# Patient Record
Sex: Male | Born: 1955 | Race: Black or African American | Hispanic: No | Marital: Single | Smoking: Former smoker
Health system: Southern US, Community
[De-identification: ages and names within clinical notes are randomized; demographics above are authoritative.]

## PROBLEM LIST (undated history)

## (undated) DIAGNOSIS — K219 Gastro-esophageal reflux disease without esophagitis: Secondary | ICD-10-CM

## (undated) DIAGNOSIS — Z87898 Personal history of other specified conditions: Secondary | ICD-10-CM

## (undated) DIAGNOSIS — E785 Hyperlipidemia, unspecified: Secondary | ICD-10-CM

## (undated) DIAGNOSIS — I10 Essential (primary) hypertension: Secondary | ICD-10-CM

## (undated) HISTORY — DX: Essential (primary) hypertension: I10

## (undated) HISTORY — PX: COLONOSCOPY: SHX174

## (undated) HISTORY — DX: Personal history of other specified conditions: Z87.898

## (undated) HISTORY — DX: Hyperlipidemia, unspecified: E78.5

## (undated) HISTORY — PX: CIRCUMCISION: SUR203

## (undated) HISTORY — PX: OTHER SURGICAL HISTORY: SHX169

## (undated) HISTORY — DX: Gastro-esophageal reflux disease without esophagitis: K21.9

## (undated) HISTORY — PX: WOUND CLOSURE SECONDARY ABDOMEN: SUR1444

## (undated) HISTORY — PX: TONSILLECTOMY: SUR1361

## (undated) HISTORY — DX: Morbid (severe) obesity due to excess calories: E66.01

---

## 1990-02-26 DIAGNOSIS — E1165 Type 2 diabetes mellitus with hyperglycemia: Secondary | ICD-10-CM

## 1997-09-09 ENCOUNTER — Other Ambulatory Visit: Admission: RE | Admit: 1997-09-09 | Discharge: 1997-09-09 | Payer: Self-pay | Admitting: Family Medicine

## 1997-12-16 ENCOUNTER — Ambulatory Visit (HOSPITAL_COMMUNITY): Admission: RE | Admit: 1997-12-16 | Discharge: 1997-12-16 | Payer: Self-pay | Admitting: Orthopedic Surgery

## 1997-12-17 ENCOUNTER — Encounter: Payer: Self-pay | Admitting: Cardiology

## 1997-12-28 ENCOUNTER — Ambulatory Visit (HOSPITAL_COMMUNITY): Admission: RE | Admit: 1997-12-28 | Discharge: 1997-12-28 | Payer: Self-pay | Admitting: Cardiology

## 1998-05-23 ENCOUNTER — Emergency Department (HOSPITAL_COMMUNITY): Admission: EM | Admit: 1998-05-23 | Discharge: 1998-05-23 | Payer: Self-pay | Admitting: Emergency Medicine

## 1998-07-24 ENCOUNTER — Emergency Department (HOSPITAL_COMMUNITY): Admission: EM | Admit: 1998-07-24 | Discharge: 1998-07-24 | Payer: Self-pay | Admitting: Emergency Medicine

## 1998-09-12 ENCOUNTER — Emergency Department (HOSPITAL_COMMUNITY): Admission: EM | Admit: 1998-09-12 | Discharge: 1998-09-12 | Payer: Self-pay | Admitting: *Deleted

## 2000-02-27 DIAGNOSIS — Z87898 Personal history of other specified conditions: Secondary | ICD-10-CM

## 2000-02-27 HISTORY — DX: Personal history of other specified conditions: Z87.898

## 2001-11-10 ENCOUNTER — Emergency Department (HOSPITAL_COMMUNITY): Admission: EM | Admit: 2001-11-10 | Discharge: 2001-11-10 | Payer: Self-pay | Admitting: Emergency Medicine

## 2003-09-07 ENCOUNTER — Emergency Department (HOSPITAL_COMMUNITY): Admission: EM | Admit: 2003-09-07 | Discharge: 2003-09-07 | Payer: Self-pay | Admitting: Family Medicine

## 2003-09-17 ENCOUNTER — Encounter: Admission: RE | Admit: 2003-09-17 | Discharge: 2003-09-17 | Payer: Self-pay | Admitting: Internal Medicine

## 2003-09-23 ENCOUNTER — Encounter: Admission: RE | Admit: 2003-09-23 | Discharge: 2003-09-23 | Payer: Self-pay | Admitting: Internal Medicine

## 2003-09-27 ENCOUNTER — Encounter: Admission: RE | Admit: 2003-09-27 | Discharge: 2003-09-27 | Payer: Self-pay | Admitting: Internal Medicine

## 2003-11-09 ENCOUNTER — Encounter (HOSPITAL_BASED_OUTPATIENT_CLINIC_OR_DEPARTMENT_OTHER): Admission: RE | Admit: 2003-11-09 | Discharge: 2004-01-07 | Payer: Self-pay | Admitting: Internal Medicine

## 2003-12-27 ENCOUNTER — Emergency Department (HOSPITAL_COMMUNITY): Admission: EM | Admit: 2003-12-27 | Discharge: 2003-12-27 | Payer: Self-pay | Admitting: Family Medicine

## 2004-02-03 ENCOUNTER — Ambulatory Visit: Payer: Self-pay | Admitting: Internal Medicine

## 2004-04-04 ENCOUNTER — Encounter (HOSPITAL_BASED_OUTPATIENT_CLINIC_OR_DEPARTMENT_OTHER): Admission: RE | Admit: 2004-04-04 | Discharge: 2004-05-26 | Payer: Self-pay | Admitting: Internal Medicine

## 2004-09-27 ENCOUNTER — Ambulatory Visit: Payer: Self-pay | Admitting: Internal Medicine

## 2004-10-05 ENCOUNTER — Ambulatory Visit: Payer: Self-pay | Admitting: Internal Medicine

## 2004-10-20 ENCOUNTER — Ambulatory Visit: Payer: Self-pay | Admitting: Internal Medicine

## 2004-10-29 ENCOUNTER — Encounter (INDEPENDENT_AMBULATORY_CARE_PROVIDER_SITE_OTHER): Payer: Self-pay | Admitting: *Deleted

## 2004-11-03 ENCOUNTER — Ambulatory Visit: Payer: Self-pay | Admitting: Internal Medicine

## 2004-11-10 ENCOUNTER — Ambulatory Visit: Payer: Self-pay | Admitting: Internal Medicine

## 2004-12-26 ENCOUNTER — Emergency Department (HOSPITAL_COMMUNITY): Admission: EM | Admit: 2004-12-26 | Discharge: 2004-12-26 | Payer: Self-pay | Admitting: Emergency Medicine

## 2005-01-03 ENCOUNTER — Emergency Department (HOSPITAL_COMMUNITY): Admission: EM | Admit: 2005-01-03 | Discharge: 2005-01-03 | Payer: Self-pay | Admitting: Emergency Medicine

## 2005-07-05 ENCOUNTER — Ambulatory Visit: Payer: Self-pay | Admitting: Internal Medicine

## 2005-09-18 ENCOUNTER — Ambulatory Visit: Payer: Self-pay | Admitting: Internal Medicine

## 2005-09-19 ENCOUNTER — Ambulatory Visit: Payer: Self-pay | Admitting: Hospitalist

## 2005-12-25 ENCOUNTER — Encounter (INDEPENDENT_AMBULATORY_CARE_PROVIDER_SITE_OTHER): Payer: Self-pay | Admitting: *Deleted

## 2005-12-25 ENCOUNTER — Ambulatory Visit: Payer: Self-pay | Admitting: Internal Medicine

## 2005-12-25 LAB — CONVERTED CEMR LAB
Creatinine, Urine: 110.6 mg/dL
Microalb Creat Ratio: 13.6 mg/g (ref 0.0–30.0)
Microalb, Ur: 1.5 mg/dL (ref 0.00–1.89)
TSH: 1.017 microintl units/mL (ref 0.350–5.50)

## 2006-01-03 DIAGNOSIS — E785 Hyperlipidemia, unspecified: Secondary | ICD-10-CM

## 2006-01-03 DIAGNOSIS — I10 Essential (primary) hypertension: Secondary | ICD-10-CM

## 2006-01-03 DIAGNOSIS — Z8679 Personal history of other diseases of the circulatory system: Secondary | ICD-10-CM | POA: Insufficient documentation

## 2006-07-29 ENCOUNTER — Encounter (INDEPENDENT_AMBULATORY_CARE_PROVIDER_SITE_OTHER): Payer: Self-pay | Admitting: Dermatology

## 2006-07-29 ENCOUNTER — Ambulatory Visit: Payer: Self-pay | Admitting: Internal Medicine

## 2006-07-29 LAB — CONVERTED CEMR LAB
ALT: 12 units/L (ref 0–53)
AST: 11 units/L (ref 0–37)
Albumin: 4.5 g/dL (ref 3.5–5.2)
Alkaline Phosphatase: 60 units/L (ref 39–117)
BUN: 15 mg/dL (ref 6–23)
Blood Glucose, Fingerstick: 289
CO2: 25 meq/L (ref 19–32)
Calcium: 9.8 mg/dL (ref 8.4–10.5)
Chloride: 99 meq/L (ref 96–112)
Creatinine, Ser: 0.98 mg/dL (ref 0.40–1.50)
Glucose, Bld: 285 mg/dL — ABNORMAL HIGH (ref 70–99)
Potassium: 4.7 meq/L (ref 3.5–5.3)
Sodium: 133 meq/L — ABNORMAL LOW (ref 135–145)
Total Bilirubin: 0.3 mg/dL (ref 0.3–1.2)
Total Protein: 7.5 g/dL (ref 6.0–8.3)

## 2006-07-30 ENCOUNTER — Telehealth (INDEPENDENT_AMBULATORY_CARE_PROVIDER_SITE_OTHER): Payer: Self-pay | Admitting: Dermatology

## 2006-08-01 ENCOUNTER — Ambulatory Visit: Payer: Self-pay | Admitting: Internal Medicine

## 2006-08-01 LAB — CONVERTED CEMR LAB: Blood Glucose, Home Monitor: 2 mg/dL

## 2006-08-12 ENCOUNTER — Ambulatory Visit: Payer: Self-pay | Admitting: Internal Medicine

## 2006-11-12 ENCOUNTER — Telehealth: Payer: Self-pay | Admitting: *Deleted

## 2006-12-01 ENCOUNTER — Emergency Department (HOSPITAL_COMMUNITY): Admission: EM | Admit: 2006-12-01 | Discharge: 2006-12-01 | Payer: Self-pay | Admitting: Emergency Medicine

## 2006-12-04 ENCOUNTER — Ambulatory Visit: Payer: Self-pay | Admitting: Hospitalist

## 2006-12-04 DIAGNOSIS — K5900 Constipation, unspecified: Secondary | ICD-10-CM | POA: Insufficient documentation

## 2006-12-04 LAB — CONVERTED CEMR LAB
Blood Glucose, Fingerstick: 145
Hgb A1c MFr Bld: 7.1 %

## 2006-12-05 ENCOUNTER — Telehealth (INDEPENDENT_AMBULATORY_CARE_PROVIDER_SITE_OTHER): Payer: Self-pay | Admitting: *Deleted

## 2007-02-17 ENCOUNTER — Telehealth: Payer: Self-pay | Admitting: *Deleted

## 2007-04-23 ENCOUNTER — Telehealth (INDEPENDENT_AMBULATORY_CARE_PROVIDER_SITE_OTHER): Payer: Self-pay | Admitting: *Deleted

## 2007-05-09 ENCOUNTER — Telehealth (INDEPENDENT_AMBULATORY_CARE_PROVIDER_SITE_OTHER): Payer: Self-pay | Admitting: *Deleted

## 2007-08-15 ENCOUNTER — Telehealth (INDEPENDENT_AMBULATORY_CARE_PROVIDER_SITE_OTHER): Payer: Self-pay | Admitting: *Deleted

## 2007-12-10 ENCOUNTER — Telehealth (INDEPENDENT_AMBULATORY_CARE_PROVIDER_SITE_OTHER): Payer: Self-pay | Admitting: *Deleted

## 2008-01-15 ENCOUNTER — Telehealth (INDEPENDENT_AMBULATORY_CARE_PROVIDER_SITE_OTHER): Payer: Self-pay | Admitting: *Deleted

## 2008-03-08 ENCOUNTER — Ambulatory Visit: Payer: Self-pay | Admitting: Internal Medicine

## 2008-03-08 ENCOUNTER — Encounter (INDEPENDENT_AMBULATORY_CARE_PROVIDER_SITE_OTHER): Payer: Self-pay | Admitting: *Deleted

## 2008-03-08 DIAGNOSIS — R000 Tachycardia, unspecified: Secondary | ICD-10-CM

## 2008-03-08 LAB — CONVERTED CEMR LAB
ALT: 17 units/L (ref 0–53)
AST: 14 units/L (ref 0–37)
Albumin: 4.6 g/dL (ref 3.5–5.2)
Alkaline Phosphatase: 60 units/L (ref 39–117)
Amphetamine Screen, Ur: NEGATIVE
BUN: 15 mg/dL (ref 6–23)
Barbiturate Quant, Ur: NEGATIVE
Benzodiazepines.: NEGATIVE
Blood Glucose, Fingerstick: 251
CO2: 24 meq/L (ref 19–32)
Calcium: 10.1 mg/dL (ref 8.4–10.5)
Chloride: 99 meq/L (ref 96–112)
Cocaine Metabolites: POSITIVE — AB
Creatinine, Ser: 0.94 mg/dL (ref 0.40–1.50)
Creatinine, Urine: 182.1 mg/dL
Creatinine,U: 179.5 mg/dL
Glucose, Bld: 211 mg/dL — ABNORMAL HIGH (ref 70–99)
Hgb A1c MFr Bld: 10.7 %
Marijuana Metabolite: NEGATIVE
Methadone: NEGATIVE
Microalb Creat Ratio: 27.4 mg/g (ref 0.0–30.0)
Microalb, Ur: 4.99 mg/dL — ABNORMAL HIGH (ref 0.00–1.89)
Opiates: NEGATIVE
Phencyclidine (PCP): NEGATIVE
Potassium: 4.1 meq/L (ref 3.5–5.3)
Propoxyphene: NEGATIVE
Sodium: 136 meq/L (ref 135–145)
TSH: 1.079 microintl units/mL (ref 0.350–4.50)
Total Bilirubin: 0.4 mg/dL (ref 0.3–1.2)
Total Protein: 7.5 g/dL (ref 6.0–8.3)

## 2008-03-09 DIAGNOSIS — F1410 Cocaine abuse, uncomplicated: Secondary | ICD-10-CM | POA: Insufficient documentation

## 2008-03-19 ENCOUNTER — Telehealth (INDEPENDENT_AMBULATORY_CARE_PROVIDER_SITE_OTHER): Payer: Self-pay | Admitting: Internal Medicine

## 2008-03-23 ENCOUNTER — Ambulatory Visit: Payer: Self-pay | Admitting: Infectious Disease

## 2008-03-23 ENCOUNTER — Encounter: Payer: Self-pay | Admitting: Internal Medicine

## 2008-03-23 LAB — CONVERTED CEMR LAB
ALT: 18 units/L (ref 0–53)
AST: 14 units/L (ref 0–37)
Albumin: 4.6 g/dL (ref 3.5–5.2)
Alkaline Phosphatase: 55 units/L (ref 39–117)
BUN: 17 mg/dL (ref 6–23)
CO2: 23 meq/L (ref 19–32)
Calcium: 10 mg/dL (ref 8.4–10.5)
Chloride: 102 meq/L (ref 96–112)
Cholesterol: 158 mg/dL (ref 0–200)
Creatinine, Ser: 0.89 mg/dL (ref 0.40–1.50)
Glucose, Bld: 283 mg/dL — ABNORMAL HIGH (ref 70–99)
HDL: 50 mg/dL (ref >39)
LDL Cholesterol: 90 mg/dL (ref 0–99)
Potassium: 4.7 meq/L (ref 3.5–5.3)
Sodium: 138 meq/L (ref 135–145)
Total Bilirubin: 0.3 mg/dL (ref 0.3–1.2)
Total CHOL/HDL Ratio: 3.2
Total Protein: 7.5 g/dL (ref 6.0–8.3)
Triglycerides: 88 mg/dL (ref <150)
VLDL: 18 mg/dL (ref 0–40)

## 2008-04-13 ENCOUNTER — Ambulatory Visit: Payer: Self-pay | Admitting: Internal Medicine

## 2008-04-13 ENCOUNTER — Encounter (INDEPENDENT_AMBULATORY_CARE_PROVIDER_SITE_OTHER): Payer: Self-pay | Admitting: *Deleted

## 2008-04-27 ENCOUNTER — Ambulatory Visit: Payer: Self-pay | Admitting: Gastroenterology

## 2008-05-07 ENCOUNTER — Ambulatory Visit: Payer: Self-pay | Admitting: Internal Medicine

## 2008-05-07 ENCOUNTER — Telehealth (INDEPENDENT_AMBULATORY_CARE_PROVIDER_SITE_OTHER): Payer: Self-pay | Admitting: *Deleted

## 2008-05-07 ENCOUNTER — Encounter (INDEPENDENT_AMBULATORY_CARE_PROVIDER_SITE_OTHER): Payer: Self-pay | Admitting: *Deleted

## 2008-05-11 ENCOUNTER — Ambulatory Visit: Payer: Self-pay | Admitting: Gastroenterology

## 2008-05-13 ENCOUNTER — Telehealth: Payer: Self-pay | Admitting: Gastroenterology

## 2008-06-09 ENCOUNTER — Encounter (INDEPENDENT_AMBULATORY_CARE_PROVIDER_SITE_OTHER): Payer: Self-pay | Admitting: *Deleted

## 2008-06-09 ENCOUNTER — Ambulatory Visit: Payer: Self-pay | Admitting: Internal Medicine

## 2008-06-09 LAB — CONVERTED CEMR LAB
Blood Glucose, Fingerstick: 77
Hgb A1c MFr Bld: 8.6 %

## 2008-06-23 ENCOUNTER — Encounter (INDEPENDENT_AMBULATORY_CARE_PROVIDER_SITE_OTHER): Payer: Self-pay | Admitting: *Deleted

## 2008-07-01 ENCOUNTER — Telehealth (INDEPENDENT_AMBULATORY_CARE_PROVIDER_SITE_OTHER): Payer: Self-pay | Admitting: *Deleted

## 2008-07-12 ENCOUNTER — Ambulatory Visit: Payer: Self-pay | Admitting: *Deleted

## 2008-07-12 LAB — CONVERTED CEMR LAB: Blood Glucose, Fingerstick: 120

## 2008-07-13 ENCOUNTER — Encounter (INDEPENDENT_AMBULATORY_CARE_PROVIDER_SITE_OTHER): Payer: Self-pay | Admitting: *Deleted

## 2008-07-14 ENCOUNTER — Telehealth: Payer: Self-pay | Admitting: *Deleted

## 2008-07-21 ENCOUNTER — Encounter (INDEPENDENT_AMBULATORY_CARE_PROVIDER_SITE_OTHER): Payer: Self-pay | Admitting: *Deleted

## 2008-07-30 ENCOUNTER — Ambulatory Visit: Payer: Self-pay | Admitting: Internal Medicine

## 2008-07-30 LAB — CONVERTED CEMR LAB: Blood Glucose, Fingerstick: 163

## 2008-08-19 ENCOUNTER — Encounter (INDEPENDENT_AMBULATORY_CARE_PROVIDER_SITE_OTHER): Payer: Self-pay | Admitting: *Deleted

## 2008-08-19 ENCOUNTER — Ambulatory Visit: Payer: Self-pay | Admitting: Internal Medicine

## 2008-08-19 LAB — CONVERTED CEMR LAB
BUN: 16 mg/dL (ref 6–23)
CO2: 22 meq/L (ref 19–32)
Calcium: 9.8 mg/dL (ref 8.4–10.5)
Chloride: 103 meq/L (ref 96–112)
Creatinine, Ser: 0.95 mg/dL (ref 0.40–1.50)
GFR calc Af Amer: >60 mL/min (ref >60)
GFR calc non Af Amer: >60 mL/min (ref >60)
Glucose, Bld: 188 mg/dL — ABNORMAL HIGH (ref 70–99)
Potassium: 4.4 meq/L (ref 3.5–5.3)
Sodium: 137 meq/L (ref 135–145)

## 2008-09-28 ENCOUNTER — Telehealth (INDEPENDENT_AMBULATORY_CARE_PROVIDER_SITE_OTHER): Payer: Self-pay | Admitting: Internal Medicine

## 2008-11-03 ENCOUNTER — Telehealth (INDEPENDENT_AMBULATORY_CARE_PROVIDER_SITE_OTHER): Payer: Self-pay | Admitting: *Deleted

## 2008-11-12 ENCOUNTER — Telehealth (INDEPENDENT_AMBULATORY_CARE_PROVIDER_SITE_OTHER): Payer: Self-pay | Admitting: *Deleted

## 2008-11-17 ENCOUNTER — Ambulatory Visit: Payer: Self-pay | Admitting: Internal Medicine

## 2008-11-17 LAB — CONVERTED CEMR LAB
Blood Glucose, Fingerstick: 173
Hgb A1c MFr Bld: 8 %

## 2008-11-27 ENCOUNTER — Emergency Department (HOSPITAL_COMMUNITY): Admission: EM | Admit: 2008-11-27 | Discharge: 2008-11-27 | Payer: Self-pay | Admitting: Emergency Medicine

## 2008-11-28 ENCOUNTER — Emergency Department (HOSPITAL_COMMUNITY): Admission: EM | Admit: 2008-11-28 | Discharge: 2008-11-28 | Payer: Self-pay | Admitting: Emergency Medicine

## 2009-04-12 ENCOUNTER — Ambulatory Visit: Payer: Self-pay | Admitting: Internal Medicine

## 2009-04-12 LAB — CONVERTED CEMR LAB
ALT: 22 units/L (ref 0–53)
AST: 21 units/L (ref 0–37)
Albumin: 4.6 g/dL (ref 3.5–5.2)
Alkaline Phosphatase: 60 units/L (ref 39–117)
BUN: 20 mg/dL (ref 6–23)
Blood Glucose, Fingerstick: 165
CO2: 25 meq/L (ref 19–32)
Calcium: 10.1 mg/dL (ref 8.4–10.5)
Chloride: 100 meq/L (ref 96–112)
Creatinine, Ser: 1.1 mg/dL (ref 0.40–1.50)
Creatinine, Urine: 244.2 mg/dL
Glucose, Bld: 162 mg/dL — ABNORMAL HIGH (ref 70–99)
Hgb A1c MFr Bld: 10.1 %
Microalb Creat Ratio: 22.6 mg/g (ref 0.0–30.0)
Microalb, Ur: 5.53 mg/dL — ABNORMAL HIGH (ref 0.00–1.89)
Potassium: 4.2 meq/L (ref 3.5–5.3)
Sodium: 137 meq/L (ref 135–145)
Total Bilirubin: 0.3 mg/dL (ref 0.3–1.2)
Total Protein: 7.4 g/dL (ref 6.0–8.3)

## 2009-04-19 ENCOUNTER — Telehealth (INDEPENDENT_AMBULATORY_CARE_PROVIDER_SITE_OTHER): Payer: Self-pay | Admitting: *Deleted

## 2009-04-20 ENCOUNTER — Ambulatory Visit: Payer: Self-pay | Admitting: Internal Medicine

## 2009-04-20 LAB — CONVERTED CEMR LAB: Blood Glucose, Fingerstick: 214

## 2009-04-28 ENCOUNTER — Telehealth (INDEPENDENT_AMBULATORY_CARE_PROVIDER_SITE_OTHER): Payer: Self-pay | Admitting: Dermatology

## 2009-05-16 ENCOUNTER — Encounter (INDEPENDENT_AMBULATORY_CARE_PROVIDER_SITE_OTHER): Payer: Self-pay | Admitting: Dermatology

## 2009-05-16 ENCOUNTER — Encounter: Payer: Self-pay | Admitting: Internal Medicine

## 2009-05-16 ENCOUNTER — Ambulatory Visit: Payer: Self-pay | Admitting: Internal Medicine

## 2009-05-16 LAB — CONVERTED CEMR LAB
Blood Glucose, Fingerstick: 104
Cholesterol: 138 mg/dL (ref 0–200)
HDL: 53 mg/dL (ref >39)
LDL Cholesterol: 71 mg/dL (ref 0–99)
Total CHOL/HDL Ratio: 2.6
Triglycerides: 71 mg/dL (ref <150)
VLDL: 14 mg/dL (ref 0–40)

## 2009-06-01 ENCOUNTER — Telehealth (INDEPENDENT_AMBULATORY_CARE_PROVIDER_SITE_OTHER): Payer: Self-pay | Admitting: Dermatology

## 2009-07-29 ENCOUNTER — Telehealth (INDEPENDENT_AMBULATORY_CARE_PROVIDER_SITE_OTHER): Payer: Self-pay | Admitting: Dermatology

## 2009-08-04 ENCOUNTER — Ambulatory Visit: Payer: Self-pay | Admitting: Internal Medicine

## 2009-08-04 LAB — CONVERTED CEMR LAB
Blood Glucose, AC Bkfst: 192 mg/dL
Hgb A1c MFr Bld: 7.9 %

## 2009-11-09 ENCOUNTER — Telehealth: Payer: Self-pay | Admitting: Internal Medicine

## 2009-11-10 ENCOUNTER — Encounter: Payer: Self-pay | Admitting: Internal Medicine

## 2009-11-11 ENCOUNTER — Telehealth (INDEPENDENT_AMBULATORY_CARE_PROVIDER_SITE_OTHER): Payer: Self-pay | Admitting: *Deleted

## 2009-11-17 ENCOUNTER — Encounter: Payer: Self-pay | Admitting: Internal Medicine

## 2009-12-07 ENCOUNTER — Telehealth (INDEPENDENT_AMBULATORY_CARE_PROVIDER_SITE_OTHER): Payer: Self-pay | Admitting: *Deleted

## 2009-12-16 ENCOUNTER — Ambulatory Visit: Payer: Self-pay | Admitting: Internal Medicine

## 2009-12-16 LAB — CONVERTED CEMR LAB
BUN: 16 mg/dL (ref 6–23)
Blood Glucose, Fingerstick: 148
CO2: 25 meq/L (ref 19–32)
Calcium: 9.8 mg/dL (ref 8.4–10.5)
Chloride: 101 meq/L (ref 96–112)
Creatinine, Ser: 0.99 mg/dL (ref 0.40–1.50)
Glucose, Bld: 119 mg/dL — ABNORMAL HIGH (ref 70–99)
Hgb A1c MFr Bld: 7.2 %
Potassium: 4.2 meq/L (ref 3.5–5.3)
Sodium: 138 meq/L (ref 135–145)

## 2010-03-03 ENCOUNTER — Ambulatory Visit: Admission: RE | Admit: 2010-03-03 | Discharge: 2010-03-03 | Payer: Self-pay | Source: Home / Self Care

## 2010-03-03 LAB — GLUCOSE, CAPILLARY: Glucose-Capillary: 257 mg/dL — ABNORMAL HIGH (ref 70–99)

## 2010-03-03 LAB — CONVERTED CEMR LAB: Blood Glucose, Fingerstick: 257

## 2010-03-23 ENCOUNTER — Ambulatory Visit: Admission: RE | Admit: 2010-03-23 | Discharge: 2010-03-23 | Payer: Self-pay | Source: Home / Self Care

## 2010-03-23 LAB — CONVERTED CEMR LAB
ALT: 18 U/L
AST: 15 U/L
Albumin: 4.1 g/dL
Alkaline Phosphatase: 43 U/L
BUN: 19 mg/dL
CO2: 23 meq/L
Calcium: 9.2 mg/dL
Chloride: 101 meq/L
Cholesterol: 103 mg/dL
Creatinine, Ser: 1.03 mg/dL
Glucose, Bld: 271 mg/dL — ABNORMAL HIGH
HDL: 42 mg/dL
LDL Cholesterol: 49 mg/dL
Potassium: 4.3 meq/L
Sodium: 135 meq/L
Total Bilirubin: 0.3 mg/dL
Total CHOL/HDL Ratio: 2.5
Total Protein: 6.5 g/dL
Triglycerides: 62 mg/dL
VLDL: 12 mg/dL

## 2010-03-23 LAB — GLUCOSE, CAPILLARY: Glucose-Capillary: 253 mg/dL — ABNORMAL HIGH (ref 70–99)

## 2010-03-30 NOTE — Progress Notes (Signed)
Summary: Refill/gh  Phone Note Refill Request Message from:  Fax from Pharmacy on July 29, 2009 3:58 PM  Refills Requested: Medication #1:  NORVASC 10 MG TABS Take 1 tablet by mouth once a day   Last Refilled: 07/04/2009  Method Requested: Electronic Initial call taken by: Angelina Ok RN,  July 29, 2009 3:58 PM  Follow-up for Phone Call        Rx faxed to pharmacy Follow-up by: Aris Lot MD,  July 29, 2009 5:35 PM    Prescriptions: NORVASC 10 MG TABS (AMLODIPINE BESYLATE) Take 1 tablet by mouth once a day  #30 x 5   Entered and Authorized by:   Aris Lot MD   Signed by:   Aris Lot MD on 07/29/2009   Method used:   Faxed to ...       [facility] Department (retail)       [address] [place], [place]  [address]       Ph: [phone]       Fax: [phone]   RxID:   [id number]

## 2010-03-30 NOTE — Assessment & Plan Note (Signed)
Summary: est-ck/fu/meds/cfb   Vital Signs:  Patient profile:   55 year old male Height:      71 inches (180.34 cm) Weight:      274.2 pounds (124.64 kg) BMI:     38.38 Temp:     97.0 degrees F (36.11 degrees C) oral Pulse rate:   94 / minute BP sitting:   146 / 98  (right arm)  Vitals Entered By: Stanton Kidney Ditzler RN (December 16, 2009 1:58 PM)  CC: Check up Is Patient Diabetic? Yes Did you bring your meter with you today? Yes Pain Assessment Patient in pain? no      Nutritional Status BMI of > 30 = obese Nutritional Status Detail appetite good CBG Result 148  Have you ever been in a relationship where you felt threatened, hurt or afraid?denies   Does patient need assistance? Functional Status Self care Ambulation Normal Comments Refill on Norvasc - out 2-3 months. Want prostate exam - missed free one.   Primary Care Provider:  Leodis Sias MD  CC:  Check up.  History of Present Illness: Patient is a 55 year old man who presents today for a follow up of his chronic medical problems including HTN, DM II, and HLD.  He reports that he has been taking his medications as prescribed and that he feels generally very well.  He has been watching what he has been eating and also trying to walk at least 5 days a week.  He got his yearly eye exam done a month ago.  Otherwise he has no complaints.  He would however like to discuss prostate cancer screening since he usually gets this done yearly at the free clinic at [facility] but missed it this year.  He denies any family history of prostate cancer, trouble starting urination, weak stream, or blood in his urine.  All of his previous screenings have been negative.    He is also interested in his yearly Flu shot.  Depression History:      The patient denies a depressed mood most of the day and a diminished interest in his usual daily activities.        The patient denies that he feels like life is not worth living, denies that he  wishes that he were dead, and denies that he has thought about ending his life.         Preventive Screening-Counseling & Management  Alcohol-Tobacco     Alcohol type: BEER /AT TIMES     Smoking Status: quit     Year Quit: 'when i was 27 for 2 months'     Passive Smoke Exposure: no  Caffeine-Diet-Exercise     Does Patient Exercise: yes     Type of exercise: WALKING     Exercise (avg: min/session):    25     Times/week:  1-2  Current Medications (verified): 1)  Lisinopril 40 Mg Tabs (Lisinopril) .... Take Two Pills One Time Per Day 2)  Hydrochlorothiazide 25 Mg Tabs (Hydrochlorothiazide) .... Take 1 Tablet By Mouth Once A Day 3)  Zocor 40 Mg Tabs (Simvastatin) .... Take 1 Tablet By Mouth Once A Day 4)  Aspir-Low 81 Mg Tbec (Aspirin) .... Take One Tablet Daily 5)  Glucophage Xr 500 Mg Xr24h-Tab (Metformin Hcl) .... Take 4 Pills Each Morning 6)  Glipizide Xl 10 Mg Xr24h-Tab (Glipizide) .... Take One Pill Each Morning. 7)  Truetrack Test  Strp (Glucose Blood) .... To Test Blood Sugar 2x/day 8)  Lancets  Misc (Lancets) .... To Test 2x/day 9)  True Track Blood Glucose   Devi (Blood Glucose Monitoring Suppl) .... To Test Blood Sugar Per Physician Instruction 10)  Norvasc 10 Mg Tabs (Amlodipine Besylate) .... Take 1 Tablet By Mouth Once A Day 11)  Miralax  Powd (Polyethylene Glycol 3350) .... Take 17 Grams Per Day.  May Mix With Water. 12)  Catapres 0.1 Mg Tabs (Clonidine Hcl) .... Take 1 Tablet By Mouth Two Times A Day  Allergies (verified): No Known Drug Allergies PMH-FH-SH reviewed-no changes except otherwise noted  Family History: No family history of prostate cancer  Social History: uses marijuana very occasionally. non-smoker. past cocaine use but currently clean.  Single, 1 daughter and 4 grandsons in good health.  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  Review of Systems  The patient denies anorexia, fever, weight loss, weight gain, vision loss, decreased  hearing, hoarseness, chest pain, syncope, dyspnea on exertion, peripheral edema, prolonged cough, headaches, hemoptysis, abdominal pain, melena, hematochezia, severe indigestion/heartburn, hematuria, incontinence, genital sores, muscle weakness, suspicious skin lesions, transient blindness, difficulty walking, depression, unusual weight change, abnormal bleeding, enlarged lymph nodes, angioedema, breast masses, and testicular masses.    Physical Exam  Additional Exam:  General: Well developed, male in no acute distress.  Vital signs reviewed.  Head: Atraumatic, normocephalic with no signs of trauma  Eyes: PERRLA, EOM intact  Ears: TM intact  Nose: Nares patent, mucosa is pink and moist, no polyps noted  Mouth: Mucosa is pink and moist, dention,   Neck: Supple, full ROM, no thyromegaly or masses noted  Resp: Clear to ascultation bilaterally, no wheezes, rales, or rhonchi noted  CV: Regular rate and rhythm with no murmurs, rubs, or gallops noted  Abdomen: Soft, non-tender, non-distended with normal bowel sounds  Musculoskelatal: ROM full with intact strength, no pain to palpation Neurologic: Alert and oriented x3, Cranial nerves II-XII grossly intact, DTR normal and symmetric  Pulses: Radial, brachial, carotid, femoral, dorsal pedis, and posterior tibial pulses equal and symmetric    Diabetes Management Exam:    Foot Exam (with socks and/or shoes not present):       Sensory-Monofilament:          Left foot: normal          Right foot: normal   Impression & Recommendations:  Problem # 1:  DIABETES MELLITUS, TYPE II (ICD-250.00) HgBA1C improved from previous result.  He is continuing to try to work on diet and exercise.  We will keep his regimen the same and continue to monitor and encourage him.  He had his last eye exam a month ago and will forward [place] a copy of his results from the doctor for our records.  His Creatinine has been rising a bit from the last two measurements so we checked a  Bmet today.  His Cr on that measurement was stable and his electrolytes are within normal limits. He has several calluses on the bottom of his feet so we will schedule him with podiatry to help care for them.  He was advised to not try to remove them himself and to remember to wear shoes with lots of padding in them to limit the pressure on his feet.    His updated medication list for this problem includes:    Lisinopril 40 Mg Tabs (Lisinopril) .Marland Kitchen... Take two pills one time per day    Aspir-low 81 Mg Tbec (Aspirin) .Marland Kitchen... Take one tablet daily    Glucophage Xr 500 Mg  Xr24h-tab (Metformin hcl) .Marland Kitchen... Take 4 pills each morning    Glipizide Xl 10 Mg Xr24h-tab (Glipizide) .Marland Kitchen... Take one pill each morning.  Orders: T- Capillary Blood Glucose (98119) T-Hgb A1C (in-house) [phone]) Podiatry Referral (Podiatry) T-Basic Metabolic Panel [phone])  Labs Reviewed: Creat: 1.10 (04/12/2009)     Last Eye Exam: No diabetic retinopathy.    (07/21/2008) Reviewed HgBA1c results: 7.2 (12/16/2009)  7.9 (08/04/2009)  Problem # 2:  HYPERTENSION (ICD-401.9) Blood pressure is a bit elevated today but he hasn't been getting his Norvasc for the last 2-3 months.  We will refill his medications for him and reassess at his follow up appointment.   His updated medication list for this problem includes:    Lisinopril 40 Mg Tabs (Lisinopril) .Marland Kitchen... Take two pills one time per day    Hydrochlorothiazide 25 Mg Tabs (Hydrochlorothiazide) .Marland Kitchen... Take 1 tablet by mouth once a day    Norvasc 10 Mg Tabs (Amlodipine besylate) .Marland Kitchen... Take 1 tablet by mouth once a day    Catapres 0.1 Mg Tabs (Clonidine hcl) .Marland Kitchen... Take 1 tablet by mouth two times a day  Orders: Podiatry Referral (Podiatry) T-Basic Metabolic Panel [phone])  BP today: 146/98 Prior BP: 136/90 (08/04/2009)  Labs Reviewed: K+: 4.2 (04/12/2009) Creat: : 1.10 (04/12/2009)   Chol: 138 (05/16/2009)   HDL: 53 (05/16/2009)   LDL: 71 (05/16/2009)   TG: 71  (05/16/2009)  Problem # 3:  HYPERLIPIDEMIA (ICD-272.4) His LDL goal is <70 with his DM.  His last LDL was 71 on the current dose of simvastatin.  He is due for a recheck in a year from his previous results.  We will continue and assess again at his follow up appointments.  He is encouraged to continue working hard on his diet and exercise.  His updated medication list for this problem includes:    Zocor 40 Mg Tabs (Simvastatin) .Marland Kitchen... Take 1 tablet by mouth once a day  Orders: Podiatry Referral (Podiatry) T-Basic Metabolic Panel [phone])  Labs Reviewed: SGOT: 21 (04/12/2009)   SGPT: 22 (04/12/2009)   HDL:53 (05/16/2009), 50 (03/23/2008)  LDL:71 (05/16/2009), 90 (03/23/2008)  Chol:138 (05/16/2009), 158 (03/23/2008)  Trig:71 (05/16/2009), 88 (03/23/2008)  Problem # 4:  Preventive Health Care (ICD-V70.0) He will give him his Flu shot today and he is up to date on all his other vaccinations.  He had a colonoscopy in 2010.  We had a lengthy discussion (25 minutes) about PSA and prostate cancer screening.  Given his lack of family history, lack of prostate symptoms, and previously normal PSA and DREs we decided that he does not need to proceed with screening at this time.  We will revisit this at his follow up studies if he has any symptoms or becomes concerned.  Complete Medication List: 1)  Lisinopril 40 Mg Tabs (Lisinopril) .... Take two pills one time per day 2)  Hydrochlorothiazide 25 Mg Tabs (Hydrochlorothiazide) .... Take 1 tablet by mouth once a day 3)  Zocor 40 Mg Tabs (Simvastatin) .... Take 1 tablet by mouth once a day 4)  Aspir-low 81 Mg Tbec (Aspirin) .... Take one tablet daily 5)  Glucophage Xr 500 Mg Xr24h-tab (Metformin hcl) .... Take 4 pills each morning 6)  Glipizide Xl 10 Mg Xr24h-tab (Glipizide) .... Take one pill each morning. 7)  Truetrack Test Strp (Glucose blood) .... To test blood sugar 2x/day 8)  Lancets Misc (Lancets) .... To test 2x/day 9)  True Track Blood  Glucose Devi (Blood glucose monitoring suppl) .... To test blood  sugar per physician instruction 10)  Norvasc 10 Mg Tabs (Amlodipine besylate) .... Take 1 tablet by mouth once a day 11)  Miralax Powd (Polyethylene glycol 3350) .... Take 17 grams per day.  may mix with water. 12)  Catapres 0.1 Mg Tabs (Clonidine hcl) .... Take 1 tablet by mouth two times a day  Other Orders: Influenza Vaccine NON MCR (04540)  Patient Instructions: 1)  Continue taking all your medications 2)  Continue working on the diet and exercise.  Weight loss will help increase your control of your diabetes and blood pressure 3)  We will call with the referral for podiatry 4)  If the results of the blood test need follow up I will call you. 5)  Follow up with me in 3 months Prescriptions: GLUCOPHAGE XR 500 MG XR24H-TAB (METFORMIN HCL) Take 4 pills each morning  #120 x 3   Entered and Authorized by:   Leodis Sias MD   Signed by:   Leodis Sias MD on 12/16/2009   Method used:   Faxed to ...       [facility] Department (retail)       [address] [place], [place]  [address]       Ph: [phone]       Fax: [phone]   RxID:   [id number] ZOCOR 40 MG TABS (SIMVASTATIN) Take 1 tablet by mouth once a day  #31 x 3   Entered and Authorized by:   Leodis Sias MD   Signed by:   Leodis Sias MD on 12/16/2009   Method used:   Faxed to ...       [facility] Department (retail)       [address] [place], [place]  [address]       Ph: [phone]       Fax: [phone]   RxID:   [id number] HYDROCHLOROTHIAZIDE 25 MG TABS (HYDROCHLOROTHIAZIDE) Take 1 tablet by mouth once a day  #31 x 6   Entered and Authorized by:   Leodis Sias MD   Signed by:   Leodis Sias MD on 12/16/2009   Method used:   Faxed to ...       [facility] Department (retail)       [address] [place], [place]  [address]       Ph:  [phone]       Fax: [phone]   RxID:   [id number] LISINOPRIL 40 MG TABS (LISINOPRIL) Take two pills one time per day  #60 x 3   Entered and Authorized by:   Leodis Sias MD   Signed by:   Leodis Sias MD on 12/16/2009   Method used:   Faxed to ...       [facility] Department (retail)       [address] [place], [place]  [address]       Ph: [phone]       Fax: [phone]   RxID:   [id number] NORVASC 10 MG TABS (AMLODIPINE BESYLATE) Take 1 tablet by mouth once a day  #30 x 5   Entered and Authorized by:   Leodis Sias MD   Signed by:   Leodis Sias MD on 12/16/2009   Method used:   Faxed to ...       [facility] Department (retail)  [address] [place], [place]  [address]       Ph: [phone]       Fax: [phone]   RxID:   [id number] GLIPIZIDE XL 10 MG XR24H-TAB (GLIPIZIDE) Take one pill each morning.  #30 x 6   Entered and Authorized by:   Leodis Sias MD   Signed by:   Leodis Sias MD on 12/16/2009   Method used:   Faxed to ...       [facility] Department (retail)       [address] [place], [place]  [address]       Ph: [phone]       Fax: [phone]   RxID:   [id number]    Orders Added: 1)  T- Capillary Blood Glucose [82948] 2)  T-Hgb A1C (in-house) [83036QW] 3)  Est. Patient Level IV [88416] 4)  Podiatry Referral [Podiatry] 5)  Influenza Vaccine NON MCR [00028] 6)  T-Basic Metabolic Panel [[id number]]   Immunizations Administered:  Influenza Vaccine # 1:    Vaccine Type: Fluvax Non-MCR    Site: left deltoid    Mfr: GlaxoSmithKline    Dose: 0.5 ml    Route: IM    Given by: Stanton Kidney Ditzler RN    Exp. Date: 08/26/2010    Lot #: [id number]    VIS given: 09/20/09 version given December 16, 2009.  Flu Vaccine Consent Questions:    Do you have a history of severe allergic reactions to this vaccine? no    Any prior history  of allergic reactions to egg and/or gelatin? no    Do you have a sensitivity to the preservative Thimersol? no    Do you have a past history of Guillan-Barre Syndrome? no    Do you currently have an acute febrile illness? no    Have you ever had a severe reaction to latex? no    Vaccine information given and explained to patient? yes   Immunizations Administered:  Influenza Vaccine # 1:    Vaccine Type: Fluvax Non-MCR    Site: left deltoid    Mfr: GlaxoSmithKline    Dose: 0.5 ml    Route: IM    Given by: Stanton Kidney Ditzler RN    Exp. Date: 08/26/2010    Lot #: [id number]    VIS given: 09/20/09 version given December 16, 2009. Process Orders Check Orders Results:     [facility]: ABN not required for this insurance Tests Sent for requisitioning (December 17, 2009 11:33 AM):     12/16/2009: [facility] -- T-Basic Metabolic Panel [phone] (signed)    Prevention & Chronic Care Immunizations   Influenza vaccine: Fluvax Non-MCR  (12/16/2009)    Tetanus booster: Not documented   Td booster deferral: Deferred  (08/04/2009)   Tetanus booster due: 12/17/2011    Pneumococcal vaccine: Pneumovax  (04/12/2009)  Colorectal Screening   Hemoccult: Not documented   Hemoccult action/deferral: Deferred  (08/04/2009)    Colonoscopy: Results: Normal.  Limited exam so Dr. Arlyce Dice recommends repeat in 5 years.     (05/11/2008)   Colonoscopy action/deferral: Repeat colonoscopy in 5 years.   (05/11/2008)   Colonoscopy due: 05/2013  Other Screening   PSA: Not documented   PSA action/deferral: Discussed-PSA declined  (12/16/2009)   Smoking status: quit  (12/16/2009)  Diabetes Mellitus   HgbA1C: 7.2  (12/16/2009)    Eye exam: No diabetic retinopathy.     (07/21/2008)  Eye exam due: 07/2009    Foot exam: yes  (12/16/2009)   High risk foot: Yes  (12/16/2009)   Foot care education: Done  (12/16/2009)   Foot exam due: 03/20/2010    Urine  microalbumin/creatinine ratio: 22.6  (04/12/2009)   Urine microalbumin action/deferral: Ordered    Diabetes flowsheet reviewed?: Yes   Progress toward A1C goal: Improved    Stage of readiness to change (diabetes management): Action  Lipids   Total Cholesterol: 138  (05/16/2009)   LDL: 71  (05/16/2009)   LDL Direct: Not documented   HDL: 53  (05/16/2009)   Triglycerides: 71  (05/16/2009)    SGOT (AST): 21  (04/12/2009)   SGPT (ALT): 22  (04/12/2009)   Alkaline phosphatase: 60  (04/12/2009)   Total bilirubin: 0.3  (04/12/2009)    Lipid flowsheet reviewed?: Yes   Progress toward LDL goal: At goal  Hypertension   Last Blood Pressure: 146 / 98  (12/16/2009)   Serum creatinine: 1.10  (04/12/2009)   Serum potassium 4.2  (04/12/2009)    Hypertension flowsheet reviewed?: Yes   Progress toward BP goal: Unchanged  Self-Management Support :   Personal Goals (by the next clinic visit) :     Personal A1C goal: 7  (12/16/2009)     Personal blood pressure goal: 130/80  (12/16/2009)     Personal LDL goal: 100  (12/16/2009)    Patient will work on the following items until the next clinic visit to reach self-care goals:     Medications and monitoring: take my medicines every day, check my blood sugar, bring all of my medications to every visit, examine my feet every day  (12/16/2009)     Eating: drink diet soda or water instead of juice or soda, eat more vegetables, use fresh or frozen vegetables, eat fruit for snacks and desserts  (12/16/2009)     Activity: take a 30 minute walk every day, park at the far end of the parking lot  (12/16/2009)    Diabetes self-management support: Copy of home glucose meter record, Written self-care plan, Education handout, Resources for patients handout  (12/16/2009)   Diabetes care plan printed   Diabetes education handout printed   Last diabetes self-management training by diabetes educator: 05/16/2009   Last medical nutrition therapy: 04/13/2008     Hypertension self-management support: Written self-care plan, Education handout, Resources for patients handout  (12/16/2009)   Hypertension self-care plan printed.   Hypertension education handout printed    Lipid self-management support: Written self-care plan, Education handout, Resources for patients handout  (12/16/2009)   Lipid self-care plan printed.   Lipid education handout printed      Resource handout printed.    Last LDL:                                                 71 (05/16/2009 6:28:00 PM)          Diabetic Foot Exam Foot Inspection Is there a history of a foot ulcer?              No Is there a foot ulcer now?              No Can the patient see the bottom of their feet?          Yes Are the shoes appropriate in style and fit?  Yes Is there swelling or an abnormal foot shape?          No Are the toenails long?                Yes Are the toenails thick?                Yes Are the toenails ingrown?              No Is there heavy callous build-up?              Yes Is there a claw toe deformity?                          No Is there elevated skin temperature?            No Is there limited ankle dorsiflexion?            No Is there foot or ankle muscle weakness?            No Do you have pain in calf while walking?           No      Diabetic Foot Care Education :Patient educated on appropriate care of diabetic feet.  Pulse Check          Right Foot          Left Foot Posterior Tibial:        1+            1+ Dorsalis Pedis:        1+            1+ Comments: Callouses noted on both feet bilaterally on the dorsum of the feet.  Will refer to podiatry High Risk Feet? Yes Set Next Diabetic Foot Exam here: 03/20/2010   10-g (5.07) Semmes-Weinstein Monofilament Test Performed by: Stanton Kidney Ditzler RN          Right Foot          Left Foot Visual Inspection     normal          Test Control      normal         normal Site 1         normal          normal Site 2         normal         normal Site 3         normal         normal Site 4         normal         normal Site 5         normal         normal Site 6         normal         normal Site 7         normal         normal Site 8         normal         normal Site 9         normal         normal Site 10         normal         normal  Impression      normal         normal  Laboratory Results   Blood Tests   Date/Time Received: December 16, 2009 2:03 PM Date/Time Reported: Burke Keels  December 16, 2009 2:03 PM  HGBA1C: 7.2%   (Normal Range: Non-Diabetic - 3-6%   Control Diabetic - 6-8%) CBG Random:: 148mg /dL     Appended Document: est-ck/fu/meds/cfb I discussed Glenn Washington with Dr Tonny Branch and I agree with his note and A/P as outlined above. Glenn Washington has well controlled chronic medical problems and is UTD on his screening tests.

## 2010-03-30 NOTE — Consult Note (Signed)
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  We will continue to work on blood sugar and HTN control from our end. Observations: Added new observation of DIAB EYE EX: Mild non-proliferative diabetic retinopathy.    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  We will continue to work on blood sugar and HTN control from our end.  Complete Medication List: 1)  Lisinopril 40 Mg Tabs (Lisinopril) .... Take two pills one time per day 2)  Hydrochlorothiazide 25 Mg Tabs (Hydrochlorothiazide) .... Take 1 tablet by mouth once a day 3)  Zocor 40 Mg Tabs (Simvastatin) .... Take 1 tablet by mouth once a day 4)  Aspir-low 81 Mg Tbec (Aspirin) .... Take one tablet daily 5)  Glucophage Xr 500 Mg Xr24h-tab (Metformin hcl) .... Take 4 pills each morning 6)  Glipizide Xl 10 Mg Xr24h-tab (Glipizide) .... Take one pill each morning. 7)  Truetrack Test Strp (Glucose blood) .... To test blood sugar 2x/day 8)  Lancets Misc (Lancets) .... To test 2x/day 9)  True Track Blood Glucose Devi (Blood glucose monitoring suppl) .... To test blood sugar per physician instruction 10)  Norvasc 10 Mg Tabs (Amlodipine besylate) .... Take 1 tablet  by mouth once a day 11)  Miralax Powd (Polyethylene glycol 3350) .... Take 17 grams per day.  may mix with water. 12)  Catapres 0.1 Mg Tabs (Clonidine hcl) .... Take 1 tablet by mouth two times a day  Diabetic Eye Exam  Procedure date:  11/10/2009  Findings:      Mild non-proliferative diabetic retinopathy.

## 2010-03-30 NOTE — Progress Notes (Signed)
Summary: diabetes support/dmr  Phone Note Outgoing Call   Call placed by: Jamison Neighbor RD,CDE,  April 19, 2009 11:05 AM Summary of Call: returned patient call: he was confirming apointment we have int he morniing- aske dhim to check blood sugar before nad afte meals for the reats of today and before bed tonight and bring record with him for review/discussion.

## 2010-03-30 NOTE — Assessment & Plan Note (Signed)
Summary: DIABETES TEACHING/CFB   Vital Signs:  Patient profile:   55 year old male Weight:      267.5 pounds BMI:     37.44 Is Patient Diabetic? Yes Did you bring your meter with you today? No CBG Result 214 Comments 118 FASTING THIS AM   Allergies: No Known Drug Allergies   Complete Medication List: 1)  Lisinopril 40 Mg Tabs (Lisinopril) .... Take two pills one time per day 2)  Hydrochlorothiazide 25 Mg Tabs (Hydrochlorothiazide) .... Take 1 tablet by mouth once a day 3)  Zocor 40 Mg Tabs (Simvastatin) .... Take 1 tablet by mouth once a day 4)  Aspir-low 81 Mg Tbec (Aspirin) .... Take one tablet daily 5)  Glucophage Xr 500 Mg Xr24h-tab (Metformin hcl) .... Take 4 pills each morning 6)  Glipizide Xl 10 Mg Xr24h-tab (Glipizide) .... Take one pill each morning. 7)  Truetrack Test Strp (Glucose blood) .... To test blood sugar 2x/day 8)  Lancets Misc (Lancets) .... To test 2x/day 9)  Colace 100 Mg Caps (Docusate sodium) .... Take 1 tablet by mouth two times a day as needed for constipation 10)  True Track Blood Glucose Devi (Blood glucose monitoring suppl) .... To test blood sugar per physician instruction 11)  Norvasc 10 Mg Tabs (Amlodipine besylate) .... Take 1 tablet by mouth once a day 12)  Hyoscyamine Sulfate 0.125 Mg Subl (Hyoscyamine sulfate) .... Put one under tongue every 4 hours as needed for abd. pain 13)  Miralax Powd (Polyethylene glycol 3350) .... Take 17 grams per day.  may mix with water. 14)  Catapres 0.1 Mg Tabs (Clonidine hcl) .... Take 1 tablet by mouth two times a day  Other Orders: DSMT(Medicare) Individual, 30 Minutes (G0108)  Patient Instructions: 1)  WRITE DOWN WHAT YOU EAT AND WHAT TIME FOR AT LEAST ONE TO TWO DAYS BEFORE YOU COME 2)  CHECK BLOOD SUGAR BEFORE ALL MEALS ON THESE DAYS 3)  MAKE A FOLLOW-UP IN 2-3 WEEKS 4)  EATING OUT- 5)   KRISPY KREME DOUGHNUTS- GLAZED AND CHOCOLATE HEALTHIER 6)  APPLEBEES- GRILLED SHRIMP AND STEAK HEALTHIER CHOICE,  7)   SMALLER PORTIONS OF SAUSAGE, PIZZA, LASGNA 8)  Glenn Washington [phone]   Last LDL:                                                 90 (03/23/2008 8:49:00 PM)       Diabetes Self Management Training  PCP: Aris Lot MD Date diagnosed with diabetes: 02/27/1988 Diabetes Type: Type 2 non-insulin treated Other persons present: no Current smoking Status: quit  Vital Signs Todays Weight: 267.5lb  in BMI 37.44in-lbs   Diabetes Medications:  Lipid lowering Meds? Yes Anti-platelet Meds? Yes Comments: patient seems to beleie that an A1C of 8.0 is target for him. he has only had an A1C near 7% one time in thte past two years. He is wokring on behavior change in regards ot acrivity and diet. Discussed methods of administration of insulin and various types that may be of benfit during rpocess of change that may take years or decades. Tried to use motivational interveiwing to elicit change talk and behavior inregards ot diabetes self managment. has not missed nay medication- knew blood sugars were getting out of control form increased eating and less exercise around holidays- uses one touch meter to chekc blood sugar, but did  not bring today. Reveiwed dietary principles per patietn request and he promises ot do better. Explained natural progression of Type 2 diabetes.     Monitoring Self monitoring blood glucose 2 times a day Name of Meter  True Track  Recent Episodes of: Requiring Help from another person  Hyperglycemia : Yes Hypoglycemia: No Severe Hypoglycemia : No     Estimated /Usual Carb Intake Breakfast # of Carbs/Grams 1 sausage patty, 2 eggs, 4 toast and jelly, 4 link sausage, hash browns Lunch # of Carbs/Grams reports problem is he snacks instead of eating meals and then eats too much- pizza, lasagna mac and cheese are problem foods Midafternoon # of Carbs/Grams snacks throughout day Dinner # of Carbs/Grams sandwich or two about 7-8 to try to have solid  food/meal  Nutrition assessment ETOH : Yes Amount per day: corona lite and reunite wine- 4/day on weekends What beverages do you drink?  water, diet drink mostly Biggest challenge to eating healthy: Eating too much- per patinet- see above   Activity Limitations  Inadequate physical activity Diabetes Disease Process  Discussed today Define diabetes in simple terms: Needs review/assistance   State diabetes is treated by meal plan-exercise-medication-monitoring-education: Demonstrates competency Medications State name-action-dose-duration-side effects-and time to take medication: Demonstrates competency   Demonstrates/verbalizes site selection and rotation for injections Not applicable   Correctly draw up and administer insulin-Byetta-Symlin-glucagon: Not applicable    Nutritional Management Identify what foods most often affect blood glucose: Demonstrates competencyState changes planned for home meals/snacks: Needs review/assistance    Monitoring  Complications State the causes-signs and symptoms and prevention of Hyperglycemia: Needs review/assistance   Explain proper treatment of hyperglycemia: Needs review/assistance   State benefits-risks-and options for improving blood sugar control: Demonstrates competency Exercise States importance of exercise: Demonstrates competency   States effect of exercise on blood glucose: Demonstrates competency   Diabetes Management Education Done: 04/21/2009    BEHAVIORAL GOALS INITIAL Incorporating appropriate nutritional management: eat smaller portions and try to eat lunch and dinner meals to decrease snacking    BEHAVIORAL GOAL FOLLOW UP  Goal attained      Diabetes Self Management Support: family and clinic staff Follow-up:3-4 weeks:

## 2010-03-30 NOTE — Progress Notes (Signed)
Summary: refill/ hla  Phone Note Refill Request Message from:  Fax from Pharmacy on November 09, 2009 12:05 PM  Refills Requested: Medication #1:  GLUCOPHAGE XR 500 MG XR24H-TAB Take 4 pills each morning   Dosage confirmed as above?Dosage Confirmed   Last Refilled: 8/17  Medication #2:  ZOCOR 40 MG TABS Take 1 tablet by mouth once a day   Dosage confirmed as above?Dosage Confirmed   Last Refilled: 8/17  Medication #3:  LISINOPRIL 40 MG TABS Take two pills one time per day   Dosage confirmed as above?Dosage Confirmed   Last Refilled: 8/17 last visit and lab 6/9  Initial call taken by: Marin Roberts RN,  November 09, 2009 12:05 PM  Follow-up for Phone Call        Rx faxed to pharmacy. Follow-up by: Margarito Liner MD,  November 09, 2009 4:03 PM    Prescriptions: GLUCOPHAGE XR 500 MG XR24H-TAB (METFORMIN HCL) Take 4 pills each morning  #120 x 3   Entered and Authorized by:   Margarito Liner MD   Signed by:   Margarito Liner MD on 11/09/2009   Method used:   Faxed to ...       [facility] (retail)       [address] [place], [place]  [address]       Ph: [phone]       Fax: [phone]   RxID:   [id number] ZOCOR 40 MG TABS (SIMVASTATIN) Take 1 tablet by mouth once a day  #31 x 3   Entered and Authorized by:   Margarito Liner MD   Signed by:   Margarito Liner MD on 11/09/2009   Method used:   Faxed to ...       [facility] (retail)       [address] [place], [place]  [address]       Ph: [phone]       Fax: [phone]   RxID:   [id number] LISINOPRIL 40 MG TABS (LISINOPRIL) Take two pills one time per day  #60 x 3   Entered and Authorized by:   Margarito Liner MD   Signed by:   Margarito Liner MD on 11/09/2009   Method used:   Faxed to ...       [facility] (retail)       [address] [place], [place]  [address]       Ph: [phone]       Fax: [phone]   RxID:    [id number]

## 2010-03-30 NOTE — Assessment & Plan Note (Signed)
Summary: DM TRAINING/VS   Vital Signs:  Patient profile:   55 year old male Weight:      271.6 pounds BMI:     38.02 Is Patient Diabetic? Yes Did you bring your meter with you today? No CBG Result 104 CBG Device ID True track Comments patient reports CBg was 94 this am at home and he is currently fasting   Allergies: No Known Drug Allergies   Complete Medication List: 1)  Lisinopril 40 Mg Tabs (Lisinopril) .... Take two pills one time per day 2)  Hydrochlorothiazide 25 Mg Tabs (Hydrochlorothiazide) .... Take 1 tablet by mouth once a day 3)  Zocor 40 Mg Tabs (Simvastatin) .... Take 1 tablet by mouth once a day 4)  Aspir-low 81 Mg Tbec (Aspirin) .... Take one tablet daily 5)  Glucophage Xr 500 Mg Xr24h-tab (Metformin hcl) .... Take 4 pills each morning 6)  Glipizide Xl 10 Mg Xr24h-tab (Glipizide) .... Take one pill each morning. 7)  Truetrack Test Strp (Glucose blood) .... To test blood sugar 2x/day 8)  Lancets Misc (Lancets) .... To test 2x/day 9)  Colace 100 Mg Caps (Docusate sodium) .... Take 1 tablet by mouth two times a day as needed for constipation 10)  True Track Blood Glucose Devi (Blood glucose monitoring suppl) .... To test blood sugar per physician instruction 11)  Norvasc 10 Mg Tabs (Amlodipine besylate) .... Take 1 tablet by mouth once a day 12)  Hyoscyamine Sulfate 0.125 Mg Subl (Hyoscyamine sulfate) .... Put one under tongue every 4 hours as needed for abd. pain 13)  Miralax Powd (Polyethylene glycol 3350) .... Take 17 grams per day.  may mix with water. 14)  Catapres 0.1 Mg Tabs (Clonidine hcl) .... Take 1 tablet by mouth two times a day  Other Orders: DSMT(Medicare) Individual, 30 Minutes (L2440)  Patient Instructions: 1)  Congrats!  Your blood sugar this morning is in target! 2)  make a follow up in 3 weeks to discuss your cholesterol test you had done today. 3)  You said you wanted to work on getting more exercise over the next 3 weeks- 3-4 times a week for  20 minutes 4)  Glenn Washington [phone]    Diabetes Self Management Training  PCP: Aris Lot MD Date diagnosed with diabetes: 02[birth year]27[birth year]1988 Diabetes Type: Type 2 non-insulin treated Other persons present: no Current smoking Status: quit  Vital Signs Todays Weight: 271.6lb  in BMI 38.02in-lbs   Assessment Daily activities: follows grandboy around house, used to like playing basketball, but doesn;t think he could run anymore Affect: Appropriate  Diabetes Medications:  Lipid lowering Meds? Yes Anti-platelet Meds? Yes Comments: did not bring meter again today. Reports blood sugar at home is averaging 150-170 sometimes after emals. Not missing nay medications now. discussed making his goals somehting he deisres to work on-and bringing meter may not be appropriate goal for him at this time.     Monitoring Self monitoring blood glucose 2 times a day Name of Meter  True track      Nutrition assessment Weight change: no ETOH : Yes Amount per day: corona lite and reunite wine- 4/day on weekends  Activity Limitations  Inadequate physical activity Diabetes Disease Process  Discussed today  Medications  Nutritional Management State changes planned for home meals/snacks: Demonstrates competency    Monitoring State purpose and frequency of monitoring BG-ketones-HgbA1C  : Demonstrates competencyPerform glucose monitoring/ketone testing and record results correctly: Demonstrates competencyState target blood glucose and HgbA1C goals: Demonstrates competencyDiabetes Management Education Done: 05/16/2009  BEHAVIORAL GOAL FOLLOW UP Incorporating appropriate nutritional management: Most of the time Specific goal set today: set goal today to try to walk 3-4 times a week for the next 3 weeks      Diabetes Self Management Support: family and clinic staff Follow-up:3-4 weeks:

## 2010-03-30 NOTE — Assessment & Plan Note (Signed)
Summary: CHECKUP/SB.   Vital Signs:  Patient profile:   55 year old male Height:      71 inches (180.34 cm) Weight:      275.0 pounds (125.00 kg) BMI:     38.49 Temp:     97.7 degrees F (36.50 degrees C) oral Pulse rate:   109 / minute BP sitting:   144 / 86  (left arm)  Vitals Entered By: Stanton Kidney Ditzler RN (March 03, 2010 1:41 PM) Is Patient Diabetic? Yes Did you bring your meter with you today? No Pain Assessment Patient in pain? no      Nutritional Status BMI of > 30 = obese Nutritional Status Detail appetite good CBG Result 257  Have you ever been in a relationship where you felt threatened, hurt or afraid?denies   Does patient need assistance? Functional Status Self care Ambulation Normal Comments Ck-up.   Primary Care Provider:  Leodis Sias MD   History of Present Illness: Patient is a 55 year old man who presents today for follow up of his chronic medical conditions including hypertension and diabetes.  He states that he has been taking his medications without side effects.  He also states that just before thanksgiving he was down about 10-15 lbs but over the holidays gained all the weight back.  During medication reconciliation he has not been taking his norvasc that we added at his last visit.    Depression History:      The patient denies a depressed mood most of the day and a diminished interest in his usual daily activities.        The patient denies that he feels like life is not worth living, denies that he wishes that he were dead, and denies that he has thought about ending his life.         Preventive Screening-Counseling & Management  Alcohol-Tobacco     Alcohol type: BEER /AT TIMES     Smoking Status: quit     Year Quit: 'when i was 60 for 2 months'     Passive Smoke Exposure: no  Caffeine-Diet-Exercise     Does Patient Exercise: yes     Type of exercise: WALKING     Exercise (avg: min/session):    25     Times/week:  1-2  Current  Medications (verified): 1)  Lisinopril 40 Mg Tabs (Lisinopril) .... Take Two Pills One Time Per Day 2)  Hydrochlorothiazide 25 Mg Tabs (Hydrochlorothiazide) .... Take 1 Tablet By Mouth Once A Day 3)  Zocor 40 Mg Tabs (Simvastatin) .... Take 1 Tablet By Mouth Once A Day 4)  Aspir-Low 81 Mg Tbec (Aspirin) .... Take One Tablet Daily 5)  Glucophage Xr 500 Mg Xr24h-Tab (Metformin Hcl) .... Take 4 Pills Each Morning 6)  Glipizide Xl 10 Mg Xr24h-Tab (Glipizide) .... Take One Pill Each Morning. 7)  Truetrack Test  Strp (Glucose Blood) .... To Test Blood Sugar 2x/day 8)  Lancets  Misc (Lancets) .... To Test 2x/day 9)  True Track Blood Glucose   Devi (Blood Glucose Monitoring Suppl) .... To Test Blood Sugar Per Physician Instruction 10)  Norvasc 10 Mg Tabs (Amlodipine Besylate) .... Take 1 Tablet By Mouth Once A Day 11)  Miralax  Powd (Polyethylene Glycol 3350) .... Take 17 Grams Per Day.  May Mix With Water. 12)  Catapres 0.1 Mg Tabs (Clonidine Hcl) .... Take 1 Tablet By Mouth Two Times A Day  Allergies (verified): No Known Drug Allergies  Past History:  Past  medical, surgical, family and social histories (including risk factors) reviewed, and no changes noted (except as noted below).  Past Medical History: Reviewed history from 12/04/2006 and no changes required. Diabetes mellitus, type II last HbA1c 10.3 (7/07) Hyperlipidemia Hypertension Morbid obesity H/o chest pain Stress test pending  Constipation  Family History: Reviewed history from 12/16/2009 and no changes required. No family history of prostate cancer  Social History: Reviewed history from 12/16/2009 and no changes required. uses marijuana very occasionally. non-smoker. past cocaine use but currently clean.  Single, 1 daughter and 4 grandsons in good health.  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  Review of Systems  The patient denies anorexia, fever, weight loss, weight gain, vision loss, decreased hearing,  hoarseness, chest pain, syncope, dyspnea on exertion, peripheral edema, prolonged cough, headaches, hemoptysis, abdominal pain, melena, hematochezia, severe indigestion/heartburn, hematuria, incontinence, genital sores, muscle weakness, suspicious skin lesions, transient blindness, difficulty walking, depression, unusual weight change, abnormal bleeding, enlarged lymph nodes, angioedema, and testicular masses.    Physical Exam  Additional Exam:  General: Well developed, male in no acute distress.  Vital signs reviewed.  Head: Atraumatic, normocephalic with no signs of trauma  Eyes: PERRLA, EOM intact  Ears: TM intact  Nose: Nares patent, mucosa is pink and moist, no polyps noted  Mouth: Mucosa is pink and moist, good dention,   Neck: Supple, full ROM, no thyromegaly or masses noted  Resp: Clear to ascultation bilaterally, no wheezes, rales, or rhonchi noted  CV: Regular rate and rhythm with no murmurs, rubs, or gallops noted  Abdomen: Soft, non-tender, non-distended with normal bowel sounds  Musculoskelatal: ROM full with intact strength, no pain to palpation  Neurologic: Alert and oriented x3, Cranial nerves II-XII grossly intact, DTR normal and symmetric  Pulses: Radial, brachial, carotid, femoral, dorsal pedis, and posterior tibial pulses equal and symmetric     Diabetes Management Exam:    Foot Exam (with socks and/or shoes not present):       Sensory-Monofilament:          Left foot: normal          Right foot: normal   Impression & Recommendations:  Problem # 1:  HYPERTENSION (ICD-401.9) He hasn't filled his norvasc because the [facility]'t [facility] it.  We will give him a script for it today and have him fill it where ever he chooses and have him come back to recheck his blood pressure.  His goal is 130/80.  We talked about symptoms of being hypotensive and I informed him to call before stopping his medications.  If he starts to drop low with the addition of the norvasc we  will drop the catapress.  His updated medication list for this problem includes:    Lisinopril 40 Mg Tabs (Lisinopril) .Marland Kitchen... Take two pills one time per day    Hydrochlorothiazide 25 Mg Tabs (Hydrochlorothiazide) .Marland Kitchen... Take 1 tablet by mouth once a day    Norvasc 10 Mg Tabs (Amlodipine besylate) .Marland Kitchen... Take 1 tablet by mouth once a day    Catapres 0.1 Mg Tabs (Clonidine hcl) .Marland Kitchen... Take 1 tablet by mouth two times a day  BP today: 144/86 Prior BP: 146/98 (12/16/2009)  Labs Reviewed: K+: 4.2 (12/16/2009) Creat: : 0.99 (12/16/2009)   Chol: 138 (05/16/2009)   HDL: 53 (05/16/2009)   LDL: 71 (05/16/2009)   TG: 71 (05/16/2009)  Problem # 2:  DIABETES MELLITUS, TYPE II (ICD-250.00) He ate just prior to his CBG today which is elevated.  He has been trying to watch his diet and is due for his A1C in the end of January.  We will check i when he comes back for his blood pressure recheck.    His updated medication list for this problem includes:    Lisinopril 40 Mg Tabs (Lisinopril) .Marland Kitchen... Take two pills one time per day    Aspir-low 81 Mg Tbec (Aspirin) .Marland Kitchen... Take one tablet daily    Glucophage Xr 500 Mg Xr24h-tab (Metformin hcl) .Marland Kitchen... Take 4 pills each morning    Glipizide Xl 10 Mg Xr24h-tab (Glipizide) .Marland Kitchen... Take one pill each morning.  Orders: Capillary Blood Glucose/CBG [phone])  Labs Reviewed: Creat: 0.99 (12/16/2009)     Last Eye Exam: Mild non-proliferative diabetic retinopathy.    (11/10/2009) Reviewed HgBA1c results: 7.2 (12/16/2009)  7.9 (08/04/2009)  Problem # 3:  HYPERLIPIDEMIA (ICD-272.4) His last LDL was close to his goal of 70. He will schedule his follow up for a morning and come back fasting to recheck his cholesterol at that time.  His updated medication list for this problem includes:    Zocor 40 Mg Tabs (Simvastatin) .Marland Kitchen... Take 1 tablet by mouth once a day    Labs Reviewed: SGOT: 21 (04/12/2009)   SGPT: 22 (04/12/2009)   HDL:53 (05/16/2009), 50 (03/23/2008)  LDL:71  (05/16/2009), 90 (60/45/4098)  Chol:138 (05/16/2009), 158 (03/23/2008)  Trig:71 (05/16/2009), 88 (03/23/2008)  Complete Medication List: 1)  Lisinopril 40 Mg Tabs (Lisinopril) .... Take two pills one time per day 2)  Hydrochlorothiazide 25 Mg Tabs (Hydrochlorothiazide) .... Take 1 tablet by mouth once a day 3)  Zocor 40 Mg Tabs (Simvastatin) .... Take 1 tablet by mouth once a day 4)  Aspir-low 81 Mg Tbec (Aspirin) .... Take one tablet daily 5)  Glucophage Xr 500 Mg Xr24h-tab (Metformin hcl) .... Take 4 pills each morning 6)  Glipizide Xl 10 Mg Xr24h-tab (Glipizide) .... Take one pill each morning. 7)  Truetrack Test Strp (Glucose blood) .... To test blood sugar 2x/day 8)  Lancets Misc (Lancets) .... To test 2x/day 9)  True Track Blood Glucose Devi (Blood glucose monitoring suppl) .... To test blood sugar per physician instruction 10)  Norvasc 10 Mg Tabs (Amlodipine besylate) .... Take 1 tablet by mouth once a day 11)  Miralax Powd (Polyethylene glycol 3350) .... Take 17 grams per day.  may mix with water. 12)  Catapres 0.1 Mg Tabs (Clonidine hcl) .... Take 1 tablet by mouth two times a day  Patient Instructions: 1)  Please fill the Norvasc and start that.   2)  Follow up in the last week of January for a blood pressure recheck and fasting lab tests. 3)  Call if you have any questions. 4)  Continue to work on diet and exercise.  It is a long road but it will make you feel better in the long run! Prescriptions: NORVASC 10 MG TABS (AMLODIPINE BESYLATE) Take 1 tablet by mouth once a day  #30 x 5   Entered and Authorized by:   Leodis Sias MD   Signed by:   Leodis Sias MD on 03/03/2010   Method used:   Print then Give to Patient   RxID:   [id number] GLUCOPHAGE XR 500 MG XR24H-TAB (METFORMIN HCL) Take 4 pills each morning  #120 x 3   Entered and Authorized by:   Leodis Sias MD   Signed by:   Leodis Sias MD on 03/03/2010   Method used:   Faxed to .Marland KitchenMarland Kitchen  [facility] Department (retail)       [address] [place], [place]  [address]       Ph: [phone]       Fax: [phone]   RxID:   [id number] ZOCOR 40 MG TABS (SIMVASTATIN) Take 1 tablet by mouth once a day  #31 x 3   Entered and Authorized by:   Leodis Sias MD   Signed by:   Leodis Sias MD on 03/03/2010   Method used:   Faxed to ...       [facility] Department (retail)       [address] [place], [place]  [address]       Ph: [phone]       Fax: [phone]   RxID:   [id number] LISINOPRIL 40 MG TABS (LISINOPRIL) Take two pills one time per day  #60 x 3   Entered and Authorized by:   Leodis Sias MD   Signed by:   Leodis Sias MD on 03/03/2010   Method used:   Faxed to ...       [facility] Department (retail)       [address] [place], [place]  [address]       Ph: [phone]       Fax: [phone]   RxID:   [id number]    Orders Added: 1)  Capillary Blood Glucose/CBG [82948] 2)  Est. Patient Level IV [25852]     Prevention & Chronic Care Immunizations   Influenza vaccine: Fluvax Non-MCR  (12/16/2009)    Tetanus booster: Not documented   Td booster deferral: Deferred  (08/04/2009)   Tetanus booster due: 12/17/2011    Pneumococcal vaccine: Pneumovax  (04/12/2009)  Colorectal Screening   Hemoccult: Not documented   Hemoccult action/deferral: Deferred  (08/04/2009)    Colonoscopy: Results: Normal.  Limited exam so Dr. Arlyce Dice recommends repeat in 5 years.     (05/11/2008)   Colonoscopy action/deferral: Repeat colonoscopy in 5 years.   (05/11/2008)   Colonoscopy due: 05/2013  Other Screening   PSA: Not documented   PSA action/deferral: Discussed-PSA declined  (12/16/2009)   Smoking status: quit  (03/03/2010)  Diabetes Mellitus   HgbA1C: 7.2  (12/16/2009)    Eye exam: Mild non-proliferative diabetic retinopathy.     (11/10/2009)   Eye exam due:  07/2009    Foot exam: yes  (03/03/2010)   High risk foot: Yes  (03/03/2010)   Foot care education: Done  (03/03/2010)   Foot exam due: 03/20/2010    Urine microalbumin/creatinine ratio: 22.6  (04/12/2009)   Urine microalbumin action/deferral: Ordered    Diabetes flowsheet reviewed?: Yes   Progress toward A1C goal: Unchanged  Lipids   Total Cholesterol: 138  (05/16/2009)   LDL: 71  (05/16/2009)   LDL Direct: Not documented   HDL: 53  (05/16/2009)   Triglycerides: 71  (05/16/2009)    SGOT (AST): 21  (04/12/2009)   SGPT (ALT): 22  (04/12/2009)   Alkaline phosphatase: 60  (04/12/2009)   Total bilirubin: 0.3  (04/12/2009)    Lipid flowsheet reviewed?: Yes   Progress toward LDL goal: Unchanged  Hypertension   Last Blood Pressure: 144 / 86  (03/03/2010)   Serum creatinine: 0.99  (12/16/2009)   Serum potassium 4.2  (12/16/2009)    Hypertension flowsheet reviewed?: Yes   Progress toward BP goal: Unchanged  Self-Management Support :  Personal Goals (by the next clinic visit) :     Personal A1C goal: 7  (12/16/2009)     Personal blood pressure goal: 130/80  (12/16/2009)     Personal LDL goal: 100  (12/16/2009)    Patient will work on the following items until the next clinic visit to reach self-care goals:     Medications and monitoring: take my medicines every day, check my blood pressure, bring all of my medications to every visit  (03/03/2010)     Eating: drink diet soda or water instead of juice or soda, eat more vegetables, use fresh or frozen vegetables, eat foods that are low in salt, eat fruit for snacks and desserts  (03/03/2010)     Activity: take a 30 minute walk every day, park at the far end of the parking lot  (03/03/2010)    Diabetes self-management support: Written self-care plan, Education handout, Resources for patients handout  (03/03/2010)   Diabetes care plan printed   Diabetes education handout printed   Last diabetes self-management training by diabetes  educator: 05/16/2009   Last medical nutrition therapy: 04/13/2008    Hypertension self-management support: Written self-care plan, Education handout, Resources for patients handout  (03/03/2010)   Hypertension self-care plan printed.   Hypertension education handout printed    Lipid self-management support: Written self-care plan, Education handout, Resources for patients handout  (03/03/2010)   Lipid self-care plan printed.   Lipid education handout printed      Resource handout printed.   Last LDL:                                                 71 (05/16/2009 6:28:00 PM)        Diabetic Foot Exam Foot Inspection Is there a history of a foot ulcer?              No Is there a foot ulcer now?              No Can the patient see the bottom of their feet?          Yes Are the shoes appropriate in style and fit?          Yes Is there swelling or an abnormal foot shape?          No Are the toenails long?                No Are the toenails thick?                Yes Are the toenails ingrown?              No Is there heavy callous build-up?              Yes Is there a claw toe deformity?                          No Is there elevated skin temperature?            No Is there limited ankle dorsiflexion?            No Is there foot or ankle muscle weakness?            No Do you have pain in calf while walking?  No      Diabetic Foot Care Education :Patient educated on appropriate care of diabetic feet.  Pulse Check          Right Foot          Left Foot Posterior Tibial:        2+            2+ Dorsalis Pedis:        2+            2+  High Risk Feet? Yes   10-g (5.07) Semmes-Weinstein Monofilament Test Performed by: Stanton Kidney Ditzler RN          Right Foot          Left Foot Visual Inspection     normal         normal Test Control      normal         normal Site 1         normal         normal Site 2         normal         normal Site 3         normal          normal Site 4         normal         normal Site 5         normal         normal Site 6         normal         normal Site 7         normal         normal Site 8         normal         normal Site 9         normal         normal Site 10         normal         normal  Impression      normal         normal  Appended Document: CHECKUP/SB.    Impression & Recommendations:  Problem # 1:  HYPERLIPIDEMIA (ICD-272.4)  I will place a future order for mr Gathright so he can get his labs drawn prior to his next appointment.  His updated medication list for this problem includes:    Zocor 40 Mg Tabs (Simvastatin) .Marland Kitchen... Take 1 tablet by mouth once a day  Labs Reviewed: SGOT: 21 (04/12/2009)   SGPT: 22 (04/12/2009)   HDL:53 (05/16/2009), 50 (03/23/2008)  LDL:71 (05/16/2009), 90 (03/23/2008)  Chol:138 (05/16/2009), 158 (03/23/2008)  Trig:71 (05/16/2009), 88 (03/23/2008)  Future Orders: T-CMP with Estimated GFR ([id number]) ... 03/23/2010 T-Lipid Profile [phone]) ... 03/23/2010  Problem # 2:  DIABETES MELLITUS, TYPE II (ICD-250.00)  Complete Medication List: 1)  Lisinopril 40 Mg Tabs (Lisinopril) .... Take two pills one time per day 2)  Hydrochlorothiazide 25 Mg Tabs (Hydrochlorothiazide) .... Take 1 tablet by mouth once a day 3)  Zocor 40 Mg Tabs (Simvastatin) .... Take 1 tablet by mouth once a day 4)  Aspir-low 81 Mg Tbec (Aspirin) .... Take one tablet daily 5)  Glucophage Xr 500 Mg Xr24h-tab (Metformin hcl) .... Take 4 pills each morning 6)  Glipizide Xl 10 Mg Xr24h-tab (Glipizide) .... Take one pill each morning. 7)  Truetrack Test Strp (Glucose blood) .... To  test blood sugar 2x/day 8)  Lancets Misc (Lancets) .... To test 2x/day 9)  True Track Blood Glucose Devi (Blood glucose monitoring suppl) .... To test blood sugar per physician instruction 10)  Norvasc 10 Mg Tabs (Amlodipine besylate) .... Take 1 tablet by mouth once a day 11)  Miralax Powd (Polyethylene glycol 3350) ....  Take 17 grams per day.  may mix with water. 12)  Catapres 0.1 Mg Tabs (Clonidine hcl) .... Take 1 tablet by mouth two times a day   Clinical Lists Changes  Problems: Assessed HYPERLIPIDEMIA as comment only -  I will place a future order for mr Fana so he can get his labs drawn prior to his next appointment.  His updated medication list for this problem includes:    Zocor 40 Mg Tabs (Simvastatin) .Marland Kitchen... Take 1 tablet by mouth once a day  Labs Reviewed: SGOT: 21 (04/12/2009)   SGPT: 22 (04/12/2009)   HDL:53 (05/16/2009), 50 (03/23/2008)  LDL:71 (05/16/2009), 90 (03/23/2008)  Chol:138 (05/16/2009), 158 (03/23/2008)  Trig:71 (05/16/2009), 88 (03/23/2008)  Future Orders: T-CMP with Estimated GFR ([id number]) ... 03/23/2010 T-Lipid Profile [id number]) ... 03/23/2010  Orders: Added new Test order of T-CMP with Estimated GFR ([id number]) - Signed Added new Test order of T-Lipid Profile ([id number]) - Signed      Appended Document: CHECKUP/SB. I discussed the patient with Dr. Tonny Branch and I agree with the assessment and plan as outlined above.

## 2010-03-30 NOTE — Assessment & Plan Note (Signed)
Summary: EST-CK/FU/MEDS/CFB   Vital Signs:  Patient profile:   55 year old male Height:      71 inches (180.34 cm) Weight:      270.1 pounds (121.23 kg) BMI:     37.33 Temp:     98.4 degrees F (36.89 degrees C) oral Pulse rate:   98 / minute BP sitting:   128 / 88  (left arm) Cuff size:   large  Vitals Entered By: Theotis Barrio NT II (April 12, 2009 3:04 PM) CC: RUNNY NOSE / FOLLOW UP ON DM AND BP,  Is Patient Diabetic? Yes Pain Assessment Patient in pain? no      Nutritional Status BMI of > 30 = obese CBG Result 165  Have you ever been in a relationship where you felt threatened, hurt or afraid?No   Does patient need assistance? Functional Status Self care Ambulation Normal Comments RUNNY NOSE / FOLLOW UP ON DM AND BP   CC:  RUNNY NOSE / FOLLOW UP ON DM AND BP and .  History of Present Illness: 55 yo man with DMII, HTN, hyperlipidemia, obesity, and hx of cocaine abuse who was last seen by me 11-17-2008 and presents for regular followup.   DMII:  At last vist his A1c had 8.6-->8.0. Patient had lost 5 pounds by dieting and exercising. Patient was motivated to continue this improvement. As such, no changes were made in his medication regimen.  Of note, the patient had thick calluses on his feet at last visit and was referred to podiatry.   He says he checks his glucose 1-2 times a day. Before he eats they are usually  ~180 before he eats.   Taking all meds without missing doses.   He says that with the cold weather he has not been walking. He also says that he is not dieting. He says he has met with Jamison Neighbor and thinks he know all the right things to do, but he has just not been doing the right things in terms of diet and exercise over the last few months.   He is very resistant to the idea of taking insulin even if his A1c is high.    HTN: Taking all meds without missing doses.    Hyperlipidemia: He has eaten within the last few hours.    Hx of cocaine  abuse: Says he is still clean. Says the last time he did cocaine was several months ago.    Depression History:      The patient denies a depressed mood most of the day and a diminished interest in his usual daily activities.         Preventive Screening-Counseling & Management  Alcohol-Tobacco     Alcohol type: BEER /AT TIMES     Smoking Status: quit     Year Quit: 'when i was 49 for 2 months'     Passive Smoke Exposure: no  Caffeine-Diet-Exercise     Does Patient Exercise: yes     Type of exercise: WALKING     Exercise (avg: min/session):    25     Times/week:  1-2  Problems Prior to Update: 1)  Cocaine Abuse  (ICD-305.60) 2)  Tachycardia  (ICD-785.0) 3)  Constipation  (ICD-564.00) 4)  Screening For Malignant Neoplasm, Colon  (ICD-V76.51) 5)  Chest Pain, Hx of  (ICD-V12.50) 6)  Obesity, Morbid  (ICD-278.01) 7)  Hypertension  (ICD-401.9) 8)  Hyperlipidemia  (ICD-272.4) 9)  Diabetes Mellitus, Type II  (ICD-250.00)  Current Medications (  verified): 1)  Lisinopril 40 Mg Tabs (Lisinopril) .... Take Two Pills One Time Per Day 2)  Hydrochlorothiazide 25 Mg Tabs (Hydrochlorothiazide) .... Take 1 Tablet By Mouth Once A Day 3)  Zocor 40 Mg Tabs (Simvastatin) .... Take 1 Tablet By Mouth Once A Day 4)  Aspir-Low 81 Mg Tbec (Aspirin) .... Take One Tablet Daily 5)  Glucophage Xr 500 Mg Xr24h-Tab (Metformin Hcl) .... Take 4 Pills Each Morning 6)  Glipizide Xl 10 Mg Xr24h-Tab (Glipizide) .... Take One Pill Each Morning. 7)  Truetrack Test  Strp (Glucose Blood) .... To Test Blood Sugar 2x/day 8)  Lancets  Misc (Lancets) .... To Test 2x/day 9)  Colace 100 Mg  Caps (Docusate Sodium) .... Take 1 Tablet By Mouth Two Times A Day As Needed For Constipation 10)  True Track Blood Glucose   Devi (Blood Glucose Monitoring Suppl) .... To Test Blood Sugar Per Physician Instruction 11)  Norvasc 10 Mg Tabs (Amlodipine Besylate) .... Take 1 Tablet By Mouth Once A Day 12)  Hyoscyamine Sulfate 0.125 Mg   Subl (Hyoscyamine Sulfate) .... Put One Under Tongue Every 4 Hours As Needed For Abd. Pain 13)  Miralax  Powd (Polyethylene Glycol 3350) .... Take 17 Grams Per Day.  May Mix With Water. 14)  Catapres 0.1 Mg Tabs (Clonidine Hcl) .... Take 1 Tablet By Mouth Two Times A Day  Allergies (verified): No Known Drug Allergies  Review of Systems  The patient denies anorexia, fever, weight loss, vision loss, decreased hearing, hoarseness, chest pain, syncope, dyspnea on exertion, peripheral edema, prolonged cough, headaches, hemoptysis, abdominal pain, melena, hematochezia, severe indigestion/heartburn, hematuria, incontinence, genital sores, muscle weakness, suspicious skin lesions, transient blindness, difficulty walking, depression, unusual weight change, abnormal bleeding, and enlarged lymph nodes.    Physical Exam  General:  alert, well-developed, well-nourished, well-hydrated, appropriate dress, normal appearance, and cooperative to examination.   Head:  normocephalic and atraumatic.   Eyes:  pupils equal, pupils round, pupils reactive to light, and no injection.   Ears:  R ear normal and L ear normal.   Nose:  no external deformity.   Lungs:  normal respiratory effort, no accessory muscle use, normal breath sounds, no crackles, and no wheezes.   Heart:  normal rate, regular rhythm, no murmur, no gallop, and no rub.   Abdomen:  soft, non-tender, and normal bowel sounds.   Extremities:  Patient has no foot sores. Patient has intact sensation. Pulses intact. Neurologic:  cranial nerves II-XII intact, strength normal in all extremities, and sensation intact to light touch.   Psych:  Oriented X3, memory intact for recent and remote, normally interactive, good eye contact, not anxious appearing, and not depressed appearing.    Diabetes Management Exam:    Foot Exam (with socks and/or shoes not present):       Sensory-Monofilament:          Left foot: normal          Right foot:  normal   Impression & Recommendations:  Problem # 1:  DIABETES MELLITUS, TYPE II (ICD-250.00) This patient's A1c has gone from 8 to 10. He has made no changes in his medications but says that his exercise has essentially stopped over the winter months due to the weather and his diet has strayed. He really does seem to know the right things to do in terms of diet and exercise but he admits he has not been doing them over the last few months. I have advised him that I would  probably recommend that he start insulin therapy. He is very resistant to this idea and seems frightened by how much his A1c has increased.  I have advised that if he does work very hard on lifestyle changes he may be able to manage without insulin but otherwise he will probably needs to start insulin in the future. I am having him meet with Jamison Neighbor to discuss lifestyle changes and am having him return in 2-3 months to recheck A1c and go from there.   Formal foot exam today was normal.  Microalb/Cr ratio 27.01 Mar 2008. Checking again today.  Diabetic Eye exam showed no retinopathy 06/2008. Due 06/2009.  Pneumovax today.   His updated medication list for this problem includes:    Lisinopril 40 Mg Tabs (Lisinopril) .Marland Kitchen... Take two pills one time per day    Aspir-low 81 Mg Tbec (Aspirin) .Marland Kitchen... Take one tablet daily    Glucophage Xr 500 Mg Xr24h-tab (Metformin hcl) .Marland Kitchen... Take 4 pills each morning    Glipizide Xl 10 Mg Xr24h-tab (Glipizide) .Marland Kitchen... Take one pill each morning.  Orders: T- Capillary Blood Glucose (82948) T-Hgb A1C (in-house) (16109UE) T-Urine Microalbumin w/creat. ratio ([phone]) T-Comprehensive Metabolic Panel ([id number])  Problem # 2:  HYPERLIPIDEMIA (ICD-272.4) Not fasting so will plan for FLP next visit.   LFTs WNL January 2010.  Checking LFTs today.  His updated medication list for this problem includes:    Zocor 40 Mg Tabs (Simvastatin) .Marland Kitchen... Take 1 tablet by mouth once a day  Problem #  3:  HYPERTENSION (ICD-401.9) At goal. Continue current regimen. Checking metabolic panel today.  His updated medication list for this problem includes:    Lisinopril 40 Mg Tabs (Lisinopril) .Marland Kitchen... Take two pills one time per day    Hydrochlorothiazide 25 Mg Tabs (Hydrochlorothiazide) .Marland Kitchen... Take 1 tablet by mouth once a day    Norvasc 10 Mg Tabs (Amlodipine besylate) .Marland Kitchen... Take 1 tablet by mouth once a day    Catapres 0.1 Mg Tabs (Clonidine hcl) .Marland Kitchen... Take 1 tablet by mouth two times a day  Orders: T-Comprehensive Metabolic Panel ([id number])  Problem # 4:  OBESITY, MORBID (ICD-278.01) As mentioned above patient has gained a few pounds since last visit and I am having him meet with Jamison Neighbor and work on intensive lifestyle changes.   Problem # 5:  COCAINE ABUSE (ICD-305.60) Patient has been clean for several months. I have counseled him about the health risks of cocaine abuse and he seems motivated to stay clean.   Problem # 6:  Preventive Health Care (ICD-V70.0) Got flu vaccine 10/2008.  Colonoscopy 04/2008 showed no neoplasm and plan for repeat in 5 years.   Got pneumovax today.   He says tetanus was  ~2005.      Complete Medication List: 1)  Lisinopril 40 Mg Tabs (Lisinopril) .... Take two pills one time per day 2)  Hydrochlorothiazide 25 Mg Tabs (Hydrochlorothiazide) .... Take 1 tablet by mouth once a day 3)  Zocor 40 Mg Tabs (Simvastatin) .... Take 1 tablet by mouth once a day 4)  Aspir-low 81 Mg Tbec (Aspirin) .... Take one tablet daily 5)  Glucophage Xr 500 Mg Xr24h-tab (Metformin hcl) .... Take 4 pills each morning 6)  Glipizide Xl 10 Mg Xr24h-tab (Glipizide) .... Take one pill each morning. 7)  Truetrack Test Strp (Glucose blood) .... To test blood sugar 2x/day 8)  Lancets Misc (Lancets) .... To test 2x/day 9)  Colace 100 Mg Caps (Docusate sodium) .... Take 1 tablet by mouth  two times a day as needed for constipation 10)  True Track Blood Glucose Devi (Blood glucose  monitoring suppl) .... To test blood sugar per physician instruction 11)  Norvasc 10 Mg Tabs (Amlodipine besylate) .... Take 1 tablet by mouth once a day 12)  Hyoscyamine Sulfate 0.125 Mg Subl (Hyoscyamine sulfate) .... Put one under tongue every 4 hours as needed for abd. pain 13)  Miralax Powd (Polyethylene glycol 3350) .... Take 17 grams per day.  may mix with water. 14)  Catapres 0.1 Mg Tabs (Clonidine hcl) .... Take 1 tablet by mouth two times a day  Other Orders: Pneumococcal Vaccine (16109) Admin 1st Vaccine ([id number])  Patient Instructions: 1)  Please make a followup appointment in 2-3 months. Please make an appointment for the next few weeks with Jamison Neighbor.  2)  Please follow the diet and exercise guidelines that we discussed. Process Orders Check Orders Results:     [facility]: ABN not required for this insurance Tests Sent for requisitioning (April 14, 2009 10:12 AM):     04/12/2009: [facility] -- T-Urine Microalbumin w/creat. ratio [[id number]] (signed)     04/12/2009: [facility] -- T-Comprehensive Metabolic Panel [phone] (signed)    Prevention & Chronic Care Immunizations   Influenza vaccine: Fluvax Non-MCR  (11/17/2008)    Tetanus booster: Not documented   Td booster deferral: Deferred  (04/12/2009)    Pneumococcal vaccine: Pneumovax  (04/12/2009)  Colorectal Screening   Hemoccult: Not documented   Hemoccult action/deferral: Deferred  (04/12/2009)    Colonoscopy: Results: Normal.  Limited exam so Dr. Arlyce Dice recommends repeat in 5 years.     (05/11/2008)   Colonoscopy action/deferral: Repeat colonoscopy in 5 years.   (05/11/2008)   Colonoscopy due: 05/2013  Other Screening   PSA: Not documented   PSA action/deferral: Discussion deferred  (04/12/2009)   Smoking status: quit  (04/12/2009)  Diabetes Mellitus   HgbA1C: 10.1  (04/12/2009)    Eye exam: No diabetic retinopathy.      (07/21/2008)   Eye exam due: 07/2009    Foot exam: yes  (04/12/2009)   High risk foot: Not documented   Foot care education: Not documented    Urine microalbumin/creatinine ratio: 27.4  (03/08/2008)   Urine microalbumin action/deferral: Ordered    Diabetes flowsheet reviewed?: Yes   Progress toward A1C goal: Deteriorated  Lipids   Total Cholesterol: 158  (03/23/2008)   LDL: 90  (03/23/2008)   LDL Direct: Not documented   HDL: 50  (03/23/2008)   Triglycerides: 88  (03/23/2008)    SGOT (AST): 14  (03/23/2008)   SGPT (ALT): 18  (03/23/2008) CMP ordered    Alkaline phosphatase: 55  (03/23/2008)   Total bilirubin: 0.3  (03/23/2008)    Lipid flowsheet reviewed?: Yes   Progress toward LDL goal: Unchanged  Hypertension   Last Blood Pressure: 128 / 88  (04/12/2009)   Serum creatinine: 0.95  (08/19/2008)   Serum potassium 4.4  (08/19/2008) CMP ordered     Hypertension flowsheet reviewed?: Yes   Progress toward BP goal: Unchanged  Self-Management Support :    Patient will work on the following items until the next clinic visit to reach self-care goals:     Medications and monitoring: take my medicines every day, check my blood sugar, bring all of my medications to every visit, examine my feet every day  (04/12/2009)     Eating: drink diet soda or water instead of juice or soda, eat more vegetables, use fresh or  frozen vegetables, eat foods that are low in salt, eat baked foods instead of fried foods, eat fruit for snacks and desserts, limit or avoid alcohol  (04/12/2009)     Activity: take a 30 minute walk every day  (04/12/2009)    Diabetes self-management support: Written self-care plan  (04/12/2009)   Diabetes care plan printed   Last diabetes self-management training by diabetes educator: 07/12/2008   Last medical nutrition therapy: 04/13/2008    Hypertension self-management support: Written self-care plan  (04/12/2009)   Hypertension self-care plan printed.    Lipid  self-management support: Written self-care plan  (04/12/2009)   Lipid self-care plan printed.   Nursing Instructions: Give Pneumovax today   Laboratory Results   Blood Tests   Date/Time Received: April 12, 2009 3:36 PM Date/Time Reported: Alric Quan  April 12, 2009 3:36 PM   HGBA1C: 10.1%   (Normal Range: Non-Diabetic - 3-6%   Control Diabetic - 6-8%) CBG Random:: 165mg /dL       Last LDL:                                                 90 (03/23/2008 8:49:00 PM)          Diabetic Foot Exam Last Podiatry Exam Date: 12/04/2006 Foot Inspection Is there a history of a foot ulcer?              No Is there a foot ulcer now?              No Can the patient see the bottom of their feet?          Yes Are the shoes appropriate in style and fit?          Yes Is there swelling or an abnormal foot shape?          No Are the toenails long?                Yes Are the toenails thick?                No Are the toenails ingrown?              No Is there heavy callous build-up?              No Is there a claw toe deformity?                          No Is there elevated skin temperature?            No Is there limited ankle dorsiflexion?            No Is there foot or ankle muscle weakness?            No Do you have pain in calf while walking?           Yes         10-g (5.07) Semmes-Weinstein Monofilament Test Performed by: Theotis Barrio NT II          Right Foot          Left Foot Visual Inspection     normal           normal Test Control      normal         normal Site 1  normal         normal Site 2         normal         normal Site 3         normal         normal Site 4         normal         normal Site 5         normal         normal Site 6         normal         normal Site 7         normal         normal Site 8         normal         normal Site 9         normal         normal Site 10         normal         normal  Impression      normal          normal   Pneumovax Vaccine    Vaccine Type: Pneumovax    Site: left deltoid    Mfr: Merck    Dose: 0.5 ml    Route: IM    Given by: Chinita Pester RN    Exp. Date: 10/01/2010    Lot #: [id number]    VIS given: 09/24/95 version given April 12, 2009.

## 2010-03-30 NOTE — Assessment & Plan Note (Signed)
Summary: EST-WANTING REF TO A KIDNEY DR/[facility]   Vital Signs:  Patient profile:   55 year old male Height:      71 inches Weight:      268.1 pounds BMI:     37.53 Temp:     98.6 degrees F oral Pulse rate:   99 / minute BP sitting:   136 / 90  (right arm)  Vitals Entered By: Filomena Jungling NT II (August 04, 2009 9:50 AM) CC: checkup Is Patient Diabetic? Yes Did you bring your meter with you today? No Pain Assessment Patient in pain? yes     Location: bilateral leg pain Intensity: 7 Type: aching Onset of pain  Chronic Nutritional Status BMI of > 30 = obese  Does patient need assistance? Functional Status Self care Ambulation Normal   CC:  checkup.  History of Present Illness: 55 yo man with DMII, HTN, hyperlipidemia, obesity, and hx of cocaine abuse who was last seen by me 04-12-2009 and presents for regular followup.  DMII: At last vist his A1c had 8.0-->10. He had made no changes in his medications but said that his exercise had essentially stopped over the winter months due to the weather and his diet had strayed. I advised him at his last visit that I would probably recommend that he start insulin therapy. He was very resistant to this idea and seemed frightened by how much his A1c had increased.  I advised that if he does work very hard on lifestyle changes he may be able to manage without insulin but otherwise he will probably needs to start insulin in the future. He has met with Jamison Neighbor a few times in the interval since our last visit. Says he is walking more frequently, eating better, and has lost a few founds. Is anxious to get his A1c result.   Formal foot exam last visit was normal. Microalb/Cr ratio 22.03 Apr 2009. Diabetic Eye exam showed no retinopathy 06/2008. Due 06/2009. Pneumovax 03-2009.  HTN: Taking all meds without missing doses.  Hyperlipidemia: lipid panel checked 04-2009 showing tri 71, HDL 53, LDL 71. Taking statin. LFTs WNL 03-2009.   Hx of cocaine  abuse: Says he is still completely clean.  Preventive Healthcare: Got flu vaccine 10/2008.  Colonoscopy 04/2008 showed no neoplasm and plan for repeat in 5 years.  Got pneumovax 03-2009.  He says tetanus was  ~2005.   He is asking about if he needs to see a kidney doctor because he has heard that people with diabetes can have kidney problems.    Preventive Screening-Counseling & Management  Alcohol-Tobacco     Alcohol type: BEER /AT TIMES     Smoking Status: quit     Year Quit: 'when i was 43 for 2 months'     Passive Smoke Exposure: no  Caffeine-Diet-Exercise     Does Patient Exercise: yes     Type of exercise: WALKING     Exercise (avg: min/session):    25     Times/week:  1-2  Problems Prior to Update: 1)  Cocaine Abuse  (ICD-305.60) 2)  Tachycardia  (ICD-785.0) 3)  Constipation  (ICD-564.00) 4)  Screening For Malignant Neoplasm, Colon  (ICD-V76.51) 5)  Chest Pain, Hx of  (ICD-V12.50) 6)  Obesity, Morbid  (ICD-278.01) 7)  Hypertension  (ICD-401.9) 8)  Hyperlipidemia  (ICD-272.4) 9)  Diabetes Mellitus, Type II  (ICD-250.00)  Current Medications (verified): 1)  Lisinopril 40 Mg Tabs (Lisinopril) .... Take Two Pills One Time Per Day  2)  Hydrochlorothiazide 25 Mg Tabs (Hydrochlorothiazide) .... Take 1 Tablet By Mouth Once A Day 3)  Zocor 40 Mg Tabs (Simvastatin) .... Take 1 Tablet By Mouth Once A Day 4)  Aspir-Low 81 Mg Tbec (Aspirin) .... Take One Tablet Daily 5)  Glucophage Xr 500 Mg Xr24h-Tab (Metformin Hcl) .... Take 4 Pills Each Morning 6)  Glipizide Xl 10 Mg Xr24h-Tab (Glipizide) .... Take One Pill Each Morning. 7)  Truetrack Test  Strp (Glucose Blood) .... To Test Blood Sugar 2x/day 8)  Lancets  Misc (Lancets) .... To Test 2x/day 9)  True Track Blood Glucose   Devi (Blood Glucose Monitoring Suppl) .... To Test Blood Sugar Per Physician Instruction 10)  Norvasc 10 Mg Tabs (Amlodipine Besylate) .... Take 1 Tablet By Mouth Once A Day 11)  Miralax  Powd (Polyethylene  Glycol 3350) .... Take 17 Grams Per Day.  May Mix With Water. 12)  Catapres 0.1 Mg Tabs (Clonidine Hcl) .... Take 1 Tablet By Mouth Two Times A Day  Allergies (verified): No Known Drug Allergies  Past History:  Past Medical History: Last updated: 12/04/2006 Diabetes mellitus, type II last HbA1c 10.3 (7/07) Hyperlipidemia Hypertension Morbid obesity H/o chest pain Stress test pending  Constipation  Social History: uses marijuana. non-smoker. past cocaine use but currently clean.  Review of Systems  The patient denies anorexia, fever, weight loss, weight gain, vision loss, decreased hearing, hoarseness, chest pain, syncope, dyspnea on exertion, peripheral edema, prolonged cough, headaches, hemoptysis, abdominal pain, melena, hematochezia, severe indigestion/heartburn, hematuria, incontinence, genital sores, muscle weakness, suspicious skin lesions, transient blindness, difficulty walking, depression, unusual weight change, abnormal bleeding, enlarged lymph nodes, and testicular masses.    Physical Exam  General:  alert, well-developed, well-nourished, and well-hydrated.   Head:  normocephalic and atraumatic.   Eyes:  vision grossly intact, pupils equal, pupils round, and pupils reactive to light.  vision grossly intact, pupils equal, pupils round, and pupils reactive to light.   Ears:  no external deformities.  no external deformities.   Nose:  no external deformity.  no external deformity.   Lungs:  normal respiratory effort and no accessory muscle use.  normal respiratory effort and no accessory muscle use.  normal respiratory effort, no accessory muscle use, normal breath sounds, no crackles, and no wheezes.   Heart:  normal rate, regular rhythm, no murmur, no gallop, and no rub.  normal rate, regular rhythm, no murmur, no gallop, and no rub.   Abdomen:  soft, non-tender, and normal bowel sounds.  soft, non-tender, and normal bowel sounds.   Neurologic:  alert & oriented X3,  cranial nerves II-XII intact, strength normal in all extremities, and sensation intact to light touch.  alert & oriented X3, cranial nerves II-XII intact, strength normal in all extremities, and sensation intact to light touch.   Skin:  turgor normal, color normal, and no rashes.  turgor normal, color normal, and no rashes.   Psych:  Oriented X3, memory intact for recent and remote, normally interactive, good eye contact, not anxious appearing, and not depressed appearing.  Oriented X3, memory intact for recent and remote, normally interactive, good eye contact, not anxious appearing, and not depressed appearing.     Impression & Recommendations:  Problem # 1:  DIABETES MELLITUS, TYPE II (ICD-250.00) A1c 7.9 today!!!! This is a dramatic reduction with lifestyle changes. I have encouraged that he keep up the good work. He has brought himself from the edge of needing insulin back to better control. Will have him return  in 3 months for recheck. Due for eye exam and am referring today. Foot exam normal last visit. Micro/alb Cr 22.6 03-2009. I have explained that we monitor his kidney function and that he has shown no signs of renal disease and that he does not need to see a nephrologist since he was asking about this. Had pneumovax 03-2009.  His updated medication list for this problem includes:    Lisinopril 40 Mg Tabs (Lisinopril) .Marland Kitchen... Take two pills one time per day    Aspir-low 81 Mg Tbec (Aspirin) .Marland Kitchen... Take one tablet daily    Glucophage Xr 500 Mg Xr24h-tab (Metformin hcl) .Marland Kitchen... Take 4 pills each morning    Glipizide Xl 10 Mg Xr24h-tab (Glipizide) .Marland Kitchen... Take one pill each morning.  Orders: T-Hgb A1C (in-house) ([id number])  Problem # 2:  COCAINE ABUSE (ICD-305.60) Currently clean. I have emphasized the importance of abstaining from cocaine.  Problem # 3:  HYPERTENSION (ICD-401.9) No changes today. Last Bmet 03-2009. Will check at next visit in 3 months.   His updated medication list for this  problem includes:    Lisinopril 40 Mg Tabs (Lisinopril) .Marland Kitchen... Take two pills one time per day    Hydrochlorothiazide 25 Mg Tabs (Hydrochlorothiazide) .Marland Kitchen... Take 1 tablet by mouth once a day    Norvasc 10 Mg Tabs (Amlodipine besylate) .Marland Kitchen... Take 1 tablet by mouth once a day    Catapres 0.1 Mg Tabs (Clonidine hcl) .Marland Kitchen... Take 1 tablet by mouth two times a day  Problem # 4:  HYPERLIPIDEMIA (ICD-272.4) Lipids checked and at goal 04-2009. Continue statin. LFTs WNL 03-2009.  His updated medication list for this problem includes:    Zocor 40 Mg Tabs (Simvastatin) .Marland Kitchen... Take 1 tablet by mouth once a day  Problem # 5:  Preventive Health Care (ICD-V70.0) Got flu vaccine 10/2008. Colonoscopy 04/2008 showed no neoplasm and plan for repeat in 5 years. Got pneumovax 03-2009. He says tetanus was  ~2005.  Complete Medication List: 1)  Lisinopril 40 Mg Tabs (Lisinopril) .... Take two pills one time per day 2)  Hydrochlorothiazide 25 Mg Tabs (Hydrochlorothiazide) .... Take 1 tablet by mouth once a day 3)  Zocor 40 Mg Tabs (Simvastatin) .... Take 1 tablet by mouth once a day 4)  Aspir-low 81 Mg Tbec (Aspirin) .... Take one tablet daily 5)  Glucophage Xr 500 Mg Xr24h-tab (Metformin hcl) .... Take 4 pills each morning 6)  Glipizide Xl 10 Mg Xr24h-tab (Glipizide) .... Take one pill each morning. 7)  Truetrack Test Strp (Glucose blood) .... To test blood sugar 2x/day 8)  Lancets Misc (Lancets) .... To test 2x/day 9)  True Track Blood Glucose Devi (Blood glucose monitoring suppl) .... To test blood sugar per physician instruction 10)  Norvasc 10 Mg Tabs (Amlodipine besylate) .... Take 1 tablet by mouth once a day 11)  Miralax Powd (Polyethylene glycol 3350) .... Take 17 grams per day.  may mix with water. 12)  Catapres 0.1 Mg Tabs (Clonidine hcl) .... Take 1 tablet by mouth two times a day  Other Orders: T- Capillary Blood Glucose ([id number]) Ophthalmology Referral (Ophthalmology)  Patient Instructions: 1)   Please return to clinic in 3 months or sooner as needed.    Prevention & Chronic Care Immunizations   Influenza vaccine: Fluvax Non-MCR  (11/17/2008)    Tetanus booster: Not documented   Td booster deferral: Deferred  (08/04/2009)    Pneumococcal vaccine: Pneumovax  (04/12/2009)  Colorectal Screening   Hemoccult: Not documented   Hemoccult action/deferral: Deferred  (  08/04/2009)    Colonoscopy: Results: Normal.  Limited exam so Dr. Arlyce Dice recommends repeat in 5 years.     (05/11/2008)   Colonoscopy action/deferral: Repeat colonoscopy in 5 years.   (05/11/2008)   Colonoscopy due: 05/2013  Other Screening   PSA: Not documented   PSA action/deferral: Discussion deferred  (08/04/2009)   Smoking status: quit  (08/04/2009)  Diabetes Mellitus   HgbA1C: 7.9  (08/04/2009)    Eye exam: No diabetic retinopathy.     (07/21/2008)   Eye exam due: 07/2009    Foot exam: yes  (04/12/2009)   High risk foot: Not documented   Foot care education: Not documented    Urine microalbumin/creatinine ratio: 22.6  (04/12/2009)   Urine microalbumin action/deferral: Ordered    Diabetes flowsheet reviewed?: Yes   Progress toward A1C goal: Improved  Lipids   Total Cholesterol: 138  (05/16/2009)   LDL: 71  (05/16/2009)   LDL Direct: Not documented   HDL: 53  (05/16/2009)   Triglycerides: 71  (05/16/2009)    SGOT (AST): 21  (04/12/2009)   SGPT (ALT): 22  (04/12/2009)   Alkaline phosphatase: 60  (04/12/2009)   Total bilirubin: 0.3  (04/12/2009)    Lipid flowsheet reviewed?: Yes   Progress toward LDL goal: Unchanged  Hypertension   Last Blood Pressure: 136 / 90  (08/04/2009)   Serum creatinine: 1.10  (04/12/2009)   Serum potassium 4.2  (04/12/2009)    Hypertension flowsheet reviewed?: Yes   Progress toward BP goal: Unchanged  Self-Management Support :    Patient will work on the following items until the next clinic visit to reach self-care goals:     Medications and monitoring:  take my medicines every day, examine my feet every day  (08/04/2009)     Eating: drink diet soda or water instead of juice or soda, eat more vegetables, use fresh or frozen vegetables, eat foods that are low in salt, eat baked foods instead of fried foods, eat fruit for snacks and desserts, limit or avoid alcohol  (08/04/2009)     Activity: take a 30 minute walk every day  (08/04/2009)    Diabetes self-management support: Education handout, Resources for patients handout, Written self-care plan  (08/04/2009)   Diabetes care plan printed   Diabetes education handout printed   Last diabetes self-management training by diabetes educator: 05/16/2009   Last medical nutrition therapy: 04/13/2008    Hypertension self-management support: Education handout, Resources for patients handout, Written self-care plan  (08/04/2009)   Hypertension self-care plan printed.   Hypertension education handout printed    Lipid self-management support: Education handout, Resources for patients handout, Written self-care plan  (08/04/2009)   Lipid self-care plan printed.   Lipid education handout printed      Resource handout printed.  Laboratory Results   Blood Tests   Date/Time Received: August 04, 2009 10:20 AM  Date/Time Reported: Alric Quan  August 04, 2009 10:20 AM   HGBA1C: 7.9%   (Normal Range: Non-Diabetic - 3-6%   Control Diabetic - 6-8%) CBG Fasting:: 192mg /dL

## 2010-03-30 NOTE — Assessment & Plan Note (Signed)
Summary: ACUTE/PRIBULA/NEEDS BP CHECK AND FASTING LABS/CH   Vital Signs:  Patient profile:   55 year old male Height:      71 inches (180.34 cm) Weight:      273.8 pounds (125.00 kg) BMI:     38.49 Temp:     97.1 degrees F (36.17 degrees C) oral Pulse rate:   81 / minute BP sitting:   129 / 87  (right arm) Cuff size:   large  Vitals Entered By: Theotis Barrio NT II (March 23, 2010 8:31 AM) CC: PATIENT STATES HE IS HERE ONLY FOR FASTING LABS AND BP CHECK Is Patient Diabetic? Yes Did you bring your meter with you today? No Pain Assessment Patient in pain? no      Nutritional Status BMI of > 30 = obese  Have you ever been in a relationship where you felt threatened, hurt or afraid?No   Does patient need assistance? Functional Status Self care Ambulation Normal   Primary Care Provider:  Leodis Sias MD  CC:  PATIENT STATES HE IS HERE ONLY FOR FASTING LABS AND BP CHECK.  History of Present Illness: Patient is a 55 year old man who presents today for follow up of his chronic medical conditions including hypertension and diabetes.     HTN:  report compliance with all meds and denies any adverse effects.  DM2:  He states that he has been taking his medications without side effects. Reports 3-4 low CBGs of 50-80 over the last month- when this occurs he drinks OJ or has a piece of candy and rechecks his CBG.  Does not have his meter with him today.  He has no other concerns or complaints today.  Preventive Screening-Counseling & Management  Alcohol-Tobacco     Alcohol type: BEER /AT TIMES     Smoking Status: quit     Year Quit: 'when i was 58 for 2 months'     Passive Smoke Exposure: no  Caffeine-Diet-Exercise     Does Patient Exercise: yes     Type of exercise: WALKING     Exercise (avg: min/session):    25     Times/week:  1-2  Current Medications (verified): 1)  Lisinopril 40 Mg Tabs (Lisinopril) .... Take Two Pills One Time Per Day 2)   Hydrochlorothiazide 25 Mg Tabs (Hydrochlorothiazide) .... Take 1 Tablet By Mouth Once A Day 3)  Zocor 40 Mg Tabs (Simvastatin) .... Take 1 Tablet By Mouth Once A Day 4)  Aspir-Low 81 Mg Tbec (Aspirin) .... Take One Tablet Daily 5)  Glucophage Xr 500 Mg Xr24h-Tab (Metformin Hcl) .... Take 4 Pills Each Morning 6)  Glipizide Xl 10 Mg Xr24h-Tab (Glipizide) .... Take One Pill Each Morning. 7)  Truetrack Test  Strp (Glucose Blood) .... To Test Blood Sugar 2x/day 8)  Lancets  Misc (Lancets) .... To Test 2x/day 9)  True Track Blood Glucose   Devi (Blood Glucose Monitoring Suppl) .... To Test Blood Sugar Per Physician Instruction 10)  Norvasc 10 Mg Tabs (Amlodipine Besylate) .... Take 1 Tablet By Mouth Once A Day 11)  Miralax  Powd (Polyethylene Glycol 3350) .... Take 17 Grams Per Day.  May Mix With Water. 12)  Catapres 0.1 Mg Tabs (Clonidine Hcl) .... Take 1 Tablet By Mouth Two Times A Day  Allergies (verified): No Known Drug Allergies  Past History:  Past medical, surgical, family and social histories (including risk factors) reviewed for relevance to current acute and chronic problems.  Past Medical History: Reviewed history from  12/04/2006 and no changes required. Diabetes mellitus, type II last HbA1c 10.3 (7/07) Hyperlipidemia Hypertension Morbid obesity H/o chest pain Stress test pending  Constipation  Family History: Reviewed history from 12/16/2009 and no changes required. No family history of prostate cancer  Social History: Reviewed history from 12/16/2009 and no changes required. uses marijuana very occasionally. non-smoker. past cocaine use but currently clean.  Single, 1 daughter and 4 grandsons in good health.  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  Physical Exam  General:  alert, well-developed, well-nourished, and well-hydrated.   Head:  normocephalic and atraumatic.   Eyes:  vision grossly intact, pupils equal, pupils round, and pupils reactive to light.    Neck:  supple and no masses.   Lungs:  normal respiratory effort and no accessory muscle use. lungs are clear to ascultation bilaterally.  no crackles and no wheezes.   Heart:  normal rate, regular rhythm, no murmur, no gallop, and no rub.   Extremities:  No clubbing, cyanosis, or edema Pulses intact. Neurologic:  alert & oriented X3, cranial nerves II-XII intact, strength normal in all extremities, and sensation intact to light touch.  gait normal.   Skin:  turgor normal, color normal, no rashes, no suspicious lesions, and no edema.   Psych:  Oriented X3, memory intact for recent and remote, normally interactive, good eye contact, not anxious appearing, and not depressed appearing.     Impression & Recommendations:  Problem # 1:  HYPERTENSION (ICD-401.9) Pt is doing very well managing his BP and is tolerating his current regimen at well.  BP at goal today, will not make any changes to his med regimen.  WIll check CMET today.  His updated medication list for this problem includes:    Lisinopril 40 Mg Tabs (Lisinopril) .Marland Kitchen... Take two pills one time per day    Hydrochlorothiazide 25 Mg Tabs (Hydrochlorothiazide) .Marland Kitchen... Take 1 tablet by mouth once a day    Norvasc 10 Mg Tabs (Amlodipine besylate) .Marland Kitchen... Take 1 tablet by mouth once a day    Catapres 0.1 Mg Tabs (Clonidine hcl) .Marland Kitchen... Take 1 tablet by mouth two times a day  Orders: T-CMP with Estimated GFR ([id number])  BP today: 129/87 Prior BP: 144/86 (03/03/2010)  Labs Reviewed: K+: 4.2 (12/16/2009) Creat: : 0.99 (12/16/2009)   Chol: 138 (05/16/2009)   HDL: 53 (05/16/2009)   LDL: 71 (05/16/2009)   TG: 71 (05/16/2009)  Problem # 2:  DIABETES MELLITUS, TYPE II (ICD-250.00) Last A1c of 7/2 in 11/2009 - will check a1c today.  Pt tolerating his current regimen well.  Reports some hypoglycemic events but does not have his meter with him for review.  He is able to demonstrate correct tx for hypoglycemia with recheck of CBG 55min-1hr after event.  Advised him to bring his meter for review at his next OV.  His updated medication list for this problem includes:    Lisinopril 40 Mg Tabs (Lisinopril) .Marland Kitchen... Take two pills one time per day    Aspir-low 81 Mg Tbec (Aspirin) .Marland Kitchen... Take one tablet daily    Glucophage Xr 500 Mg Xr24h-tab (Metformin hcl) .Marland Kitchen... Take 4 pills each morning    Glipizide Xl 10 Mg Xr24h-tab (Glipizide) .Marland Kitchen... Take one pill each morning.  Orders: T- Capillary Blood Glucose (91478) T-Hgb A1C (in-house) ([id number])  Labs Reviewed: Creat: 0.99 (12/16/2009)     Last Eye Exam: Mild non-proliferative diabetic retinopathy.    (11/10/2009) Reviewed HgBA1c results: 7.2 (12/16/2009)  7.9 (08/04/2009)  Problem # 3:  HYPERLIPIDEMIA (ICD-272.4)  Pt is tolerating statin well without adverse effect.  He is fasting this am; will check FLP and LFTs.  His updated medication list for this problem includes:    Zocor 40 Mg Tabs (Simvastatin) .Marland Kitchen... Take 1 tablet by mouth once a day  Orders: T-Lipid Profile [phone])  Labs Reviewed: SGOT: 21 (04/12/2009)   SGPT: 22 (04/12/2009)   HDL:53 (05/16/2009), 50 (03/23/2008)  LDL:71 (05/16/2009), 90 (03/23/2008)  Chol:138 (05/16/2009), 158 (03/23/2008)  Trig:71 (05/16/2009), 88 (03/23/2008)  Problem # 4:  Preventive Health Care (ICD-V70.0) Pt is UTD on vaccinations, colonoscopy, diabetic eye exam.  He will meet with Monique today re: colonoscopy study.  Complete Medication List: 1)  Lisinopril 40 Mg Tabs (Lisinopril) .... Take two pills one time per day 2)  Hydrochlorothiazide 25 Mg Tabs (Hydrochlorothiazide) .... Take 1 tablet by mouth once a day 3)  Zocor 40 Mg Tabs (Simvastatin) .... Take 1 tablet by mouth once a day 4)  Aspir-low 81 Mg Tbec (Aspirin) .... Take one tablet daily 5)  Glucophage Xr 500 Mg Xr24h-tab (Metformin hcl) .... Take 4 pills each morning 6)  Glipizide Xl 10 Mg Xr24h-tab (Glipizide) .... Take one pill each morning. 7)  Truetrack Test Strp (Glucose blood) ....  To test blood sugar 2x/day 8)  Lancets Misc (Lancets) .... To test 2x/day 9)  True Track Blood Glucose Devi (Blood glucose monitoring suppl) .... To test blood sugar per physician instruction 10)  Norvasc 10 Mg Tabs (Amlodipine besylate) .... Take 1 tablet by mouth once a day 11)  Miralax Powd (Polyethylene glycol 3350) .... Take 17 grams per day.  may mix with water. 12)  Catapres 0.1 Mg Tabs (Clonidine hcl) .... Take 1 tablet by mouth two times a day  Patient Instructions: 1)  Please schedule a follow-up appointment in 3 months with Dr. Tonny Branch. 2)  Bring your blood sugar meter with you to your next appointment. 3)  Keep taking all of your medicine as directed. 4)  Keep up the good work!   Orders Added: 1)  T- Capillary Blood Glucose [82948] 2)  T-Hgb A1C (in-house) [[id number]] 3)  T-Lipid Profile [[id number]] 4)  T-CMP with Estimated GFR [[id number]] 5)  Est. Patient Level III [86578]   Process Orders Check Orders Results:     [facility]: ABN not required for this insurance Tests Sent for requisitioning (March 23, 2010 8:52 AM):     03/23/2010: [facility] -- T-Lipid Profile [phone] (signed)     03/23/2010: [facility] -- T-CMP with Estimated GFR [[id number]] (signed)     Prevention & Chronic Care Immunizations   Influenza vaccine: Fluvax Non-MCR  (12/16/2009)    Tetanus booster: Not documented   Td booster deferral: Deferred  (08/04/2009)   Tetanus booster due: 12/17/2011    Pneumococcal vaccine: Pneumovax  (04/12/2009)  Colorectal Screening   Hemoccult: Not documented   Hemoccult action/deferral: Deferred  (08/04/2009)    Colonoscopy: Results: Normal.  Limited exam so Dr. Arlyce Dice recommends repeat in 5 years.     (05/11/2008)   Colonoscopy action/deferral: Repeat colonoscopy in 5 years.   (05/11/2008)   Colonoscopy due: 05/2013  Other Screening   PSA: Not documented   PSA action/deferral: Discussed-PSA  declined  (12/16/2009)   Smoking status: quit  (03/23/2010)  Diabetes Mellitus   HgbA1C: 7.2  (12/16/2009)    Eye exam: Mild non-proliferative diabetic retinopathy.     (11/10/2009)   Eye exam due: 07/2009    Foot exam: yes  (03/03/2010)  High risk foot: Yes  (03/03/2010)   Foot care education: Done  (03/03/2010)   Foot exam due: 03/20/2010    Urine microalbumin/creatinine ratio: 22.6  (04/12/2009)   Urine microalbumin action/deferral: Ordered    Diabetes flowsheet reviewed?: Yes   Progress toward A1C goal: At goal  Lipids   Total Cholesterol: 138  (05/16/2009)   LDL: 71  (05/16/2009)   LDL Direct: Not documented   HDL: 53  (05/16/2009)   Triglycerides: 71  (05/16/2009)    SGOT (AST): 21  (04/12/2009)   SGPT (ALT): 22  (04/12/2009)   Alkaline phosphatase: 60  (04/12/2009)   Total bilirubin: 0.3  (04/12/2009)    Lipid flowsheet reviewed?: Yes   Progress toward LDL goal: At goal  Hypertension   Last Blood Pressure: 129 / 87  (03/23/2010)   Serum creatinine: 0.99  (12/16/2009)   Serum potassium 4.2  (12/16/2009)    Hypertension flowsheet reviewed?: Yes   Progress toward BP goal: At goal  Self-Management Support :   Personal Goals (by the next clinic visit) :     Personal A1C goal: 7  (12/16/2009)     Personal blood pressure goal: 130/80  (12/16/2009)     Personal LDL goal: 100  (12/16/2009)    Diabetes self-management support: Written self-care plan, Education handout, Resources for patients handout  (03/03/2010)   Last diabetes self-management training by diabetes educator: 05/16/2009   Last medical nutrition therapy: 04/13/2008    Hypertension self-management support: Written self-care plan, Education handout, Resources for patients handout  (03/03/2010)    Lipid self-management support: Written self-care plan, Education handout, Resources for patients handout  (03/03/2010)     Appended Document: Lab Order/a1c and cbg results    Lab Visit  Laboratory  Results   Blood Tests   Date/Time Received: March 23, 2010 9:27 AM Date/Time Reported: Alric Quan  March 23, 2010 9:27 AM  HGBA1C: 8.8%   (Normal Range: Non-Diabetic - 3-6%   Control Diabetic - 6-8%) CBG Fasting:: 253mg /dL    Orders Today:

## 2010-03-30 NOTE — Progress Notes (Signed)
Summary: refill/ hla  Phone Note Refill Request Message from:  Fax from Pharmacy on April 28, 2009 6:01 PM  Refills Requested: Medication #1:  GLIPIZIDE XL 10 MG XR24H-TAB Take one pill each morning.   Last Refilled: 1/31  Medication #2:  HYDROCHLOROTHIAZIDE 25 MG TABS Take 1 tablet by mouth once a day   Last Refilled: 1/31 last visit and labs 2/15  Initial call taken by: Marin Roberts RN,  April 28, 2009 6:01 PM  Follow-up for Phone Call        Rx faxed to pharmacy Follow-up by: Aris Lot MD,  April 29, 2009 7:00 AM    Prescriptions: GLIPIZIDE XL 10 MG XR24H-TAB (GLIPIZIDE) Take one pill each morning.  #30 x 6   Entered and Authorized by:   Aris Lot MD   Signed by:   Aris Lot MD on 04/29/2009   Method used:   Faxed to ...       [facility] Department (retail)       [address] [place], [place]  [address]       Ph: [phone]       Fax: [phone]   RxID:   [id number] HYDROCHLOROTHIAZIDE 25 MG TABS (HYDROCHLOROTHIAZIDE) Take 1 tablet by mouth once a day  #31 x 6   Entered and Authorized by:   Aris Lot MD   Signed by:   Aris Lot MD on 04/29/2009   Method used:   Faxed to ...       [facility] Department (retail)       [address] [place], [place]  [address]       Ph: [phone]       Fax: [phone]   RxID:   [id number]

## 2010-03-30 NOTE — Letter (Signed)
Summary: 11-17-2009-10-21-2011GLUCOSE   11-17-2009-10-21-2011GLUCOSE   Imported By: Margie Billet 12/20/2009 11:33:37  _____________________________________________________________________  External Attachment:    Type:   Image     Comment:   External Document

## 2010-03-30 NOTE — Progress Notes (Signed)
Summary: diabetes support/dmr  Phone Note Outgoing Call   Call placed by: Jamison Neighbor RD,CDE,  May 07, 2008 10:59 AM Summary of Call: called to reschedule appointment. left message.  Follow-up for Phone Call        note patient has rescheduled for 06/09/08 Follow-up by: Jamison Neighbor RD,CDE,  May 10, 2008 1:31 PM

## 2010-03-30 NOTE — Progress Notes (Signed)
Summary: REfill/gh  Phone Note Refill Request Message from:  Fax from Pharmacy on June 01, 2009 4:53 PM  Refills Requested: Medication #1:  CATAPRES 0.1 MG TABS Take 1 tablet by mouth two times a day.   Last Refilled: 04/28/2009  Medication #2:  GLUCOPHAGE XR 500 MG XR24H-TAB Take 4 pills each morning   Last Refilled: 05/09/2009  Medication #3:  ZOCOR 40 MG TABS Take 1 tablet by mouth once a day   Last Refilled: 04/28/2009  Medication #4:  LISINOPRIL 40 MG TABS Take two pills one time per day   Last Refilled: 04/28/2009  Method Requested: Fax to Local Pharmacy Initial call taken by: Angelina Ok RN,  June 01, 2009 4:53 PM  Follow-up for Phone Call        Rx faxed to pharmacy Follow-up by: Aris Lot MD,  June 01, 2009 6:09 PM    Prescriptions: CATAPRES 0.1 MG TABS (CLONIDINE HCL) Take 1 tablet by mouth two times a day  #60 x 4   Entered and Authorized by:   Aris Lot MD   Signed by:   Aris Lot MD on 06/01/2009   Method used:   Faxed to ...       [facility] Department (retail)       [address] [place], [place]  [address]       Ph: [phone]       Fax: [phone]   RxID:   [id number] GLUCOPHAGE XR 500 MG XR24H-TAB (METFORMIN HCL) Take 4 pills each morning  #120 x 4   Entered and Authorized by:   Aris Lot MD   Signed by:   Aris Lot MD on 06/01/2009   Method used:   Faxed to ...       [facility] Department (retail)       [address] [place], [place]  [address]       Ph: [phone]       Fax: [phone]   RxID:   [id number] ZOCOR 40 MG TABS (SIMVASTATIN) Take 1 tablet by mouth once a day  #31 x 4   Entered and Authorized by:   Aris Lot MD   Signed by:   Aris Lot MD on 06/01/2009   Method used:   Faxed to ...       [facility] Department (retail)       [address] [place], [place]  [address]       Ph: [phone]       Fax:  [phone]   RxID:   [id number] LISINOPRIL 40 MG TABS (LISINOPRIL) Take two pills one time per day  #60 x 4   Entered and Authorized by:   Aris Lot MD   Signed by:   Aris Lot MD on 06/01/2009   Method used:   Faxed to ...       [facility] Department (retail)       [address] [place], [place]  [address]       Ph: [phone]       Fax: [phone]   RxID:   [id number]

## 2010-03-30 NOTE — Progress Notes (Signed)
Summary: med refill/gp  Phone Note Refill Request Message from:  Fax from Pharmacy on December 07, 2009 3:34 PM  Refills Requested: Medication #1:  CATAPRES 0.1 MG TABS Take 1 tablet by mouth two times a day.   Last Refilled: 11/08/2009 Last appt. June 9.   Next appt 10/21.   Method Requested: Telephone to Pharmacy Initial call taken by: Chinita Pester RN,  December 07, 2009 3:35 PM    Prescriptions: CATAPRES 0.1 MG TABS (CLONIDINE HCL) Take 1 tablet by mouth two times a day  #60 x 6   Entered and Authorized by:   Zoila Shutter MD   Signed by:   Zoila Shutter MD on 12/07/2009   Method used:   Faxed to ...       [facility] Department (retail)       [address] [place], [place]  [address]       Ph: [phone]       Fax: [phone]   RxID:   [id number]

## 2010-03-30 NOTE — Miscellaneous (Signed)
Summary: order for FLP      Process Orders Check Orders Results:     [facility]: ABN not required for this insurance Order queued for requisitioning for [facility]: May 16, 2009 9:53 AM  Tests Sent for requisitioning (May 16, 2009 9:53 AM):     05/16/2009: [facility] -- T-Lipid Profile [phone] (signed)

## 2010-03-30 NOTE — Progress Notes (Signed)
Summary: med refill/gp  Phone Note Refill Request Message from:  Patient on November 11, 2009 9:52 AM  Refills Requested: Medication #1:  HYDROCHLOROTHIAZIDE 25 MG TABS Take 1 tablet by mouth once a day Last appt. June 9.   Method Requested: Fax to Local Pharmacy Initial call taken by: Chinita Pester RN,  November 11, 2009 9:52 AM    Prescriptions: HYDROCHLOROTHIAZIDE 25 MG TABS (HYDROCHLOROTHIAZIDE) Take 1 tablet by mouth once a day  #31 x 6   Entered and Authorized by:   Zoila Shutter MD   Signed by:   Zoila Shutter MD on 11/11/2009   Method used:   Faxed to ...       [facility] Department (retail)       [address] [place], [place]  [address]       Ph: [phone]       Fax: [phone]   RxID:   [id number]

## 2010-04-19 ENCOUNTER — Other Ambulatory Visit: Payer: Self-pay | Admitting: *Deleted

## 2010-04-21 MED ORDER — POLYETHYLENE GLYCOL 3350 17 GM/SCOOP PO POWD: 17.0000 g | Freq: Every day | ORAL | Status: AC

## 2010-04-24 NOTE — Telephone Encounter (Signed)
Called to pharm 

## 2010-05-10 LAB — GLUCOSE, CAPILLARY: Glucose-Capillary: 148 mg/dL — ABNORMAL HIGH (ref 70–99)

## 2010-05-15 LAB — GLUCOSE, CAPILLARY: Glucose-Capillary: 192 mg/dL — ABNORMAL HIGH (ref 70–99)

## 2010-05-17 LAB — GLUCOSE, CAPILLARY: Glucose-Capillary: 165 mg/dL — ABNORMAL HIGH (ref 70–99)

## 2010-06-01 LAB — WOUND CULTURE

## 2010-06-01 LAB — GLUCOSE, CAPILLARY: Glucose-Capillary: 192 mg/dL — ABNORMAL HIGH (ref 70–99)

## 2010-06-02 LAB — GLUCOSE, CAPILLARY: Glucose-Capillary: 173 mg/dL — ABNORMAL HIGH (ref 70–99)

## 2010-06-05 LAB — GLUCOSE, CAPILLARY: Glucose-Capillary: 163 mg/dL — ABNORMAL HIGH (ref 70–99)

## 2010-06-06 LAB — GLUCOSE, CAPILLARY: Glucose-Capillary: 120 mg/dL — ABNORMAL HIGH (ref 70–99)

## 2010-06-07 LAB — GLUCOSE, CAPILLARY: Glucose-Capillary: 77 mg/dL (ref 70–99)

## 2010-06-08 LAB — GLUCOSE, CAPILLARY: Glucose-Capillary: 220 mg/dL — ABNORMAL HIGH (ref 70–99)

## 2010-07-11 ENCOUNTER — Ambulatory Visit (INDEPENDENT_AMBULATORY_CARE_PROVIDER_SITE_OTHER): Payer: Self-pay | Admitting: Internal Medicine

## 2010-07-11 ENCOUNTER — Encounter: Payer: Self-pay | Admitting: Internal Medicine

## 2010-07-11 DIAGNOSIS — I10 Essential (primary) hypertension: Secondary | ICD-10-CM

## 2010-07-11 DIAGNOSIS — E785 Hyperlipidemia, unspecified: Secondary | ICD-10-CM

## 2010-07-11 DIAGNOSIS — E119 Type 2 diabetes mellitus without complications: Secondary | ICD-10-CM

## 2010-07-11 LAB — POCT GLYCOSYLATED HEMOGLOBIN (HGB A1C): Hemoglobin A1C: 8.5

## 2010-07-11 LAB — GLUCOSE, CAPILLARY: Glucose-Capillary: 220 mg/dL — ABNORMAL HIGH (ref 70–99)

## 2010-07-11 MED ORDER — AMLODIPINE BESYLATE 10 MG PO TABS: 10.0000 mg | ORAL_TABLET | Freq: Every day | ORAL | Status: DC

## 2010-07-11 MED ORDER — CLONIDINE HCL 0.1 MG PO TABS: 0.1000 mg | ORAL_TABLET | Freq: Two times a day (BID) | ORAL | Status: DC

## 2010-07-11 MED ORDER — SIMVASTATIN 40 MG PO TABS: 40.0000 mg | ORAL_TABLET | Freq: Every day | ORAL | Status: DC

## 2010-07-11 MED ORDER — HYDROCHLOROTHIAZIDE 25 MG PO TABS: 25.0000 mg | ORAL_TABLET | Freq: Every day | ORAL | Status: DC

## 2010-07-11 MED ORDER — GLIPIZIDE ER 10 MG PO TB24: 10.0000 mg | ORAL_TABLET | Freq: Every day | ORAL | Status: DC

## 2010-07-11 MED ORDER — METFORMIN HCL ER 500 MG PO TB24: 2000.0000 mg | ORAL_TABLET | Freq: Every day | ORAL | Status: DC

## 2010-07-11 MED ORDER — LISINOPRIL 40 MG PO TABS: 80.0000 mg | ORAL_TABLET | Freq: Every day | ORAL | Status: DC

## 2010-07-11 NOTE — Assessment & Plan Note (Signed)
He is actually down about 8 lbs from his last visit.  He hasn't really changed his diet and doesn't really know what he needs to do to help control his diabetes.  We talked about portion control and making good smart healthy choices for his diet.  I gave him some information from [employer] about the diabetic meal planning and exercise in diabetic patients and encouraged him to work hard to lose weight since it will help with his BP control as well as his control of his diabetes.

## 2010-07-11 NOTE — Patient Instructions (Signed)
Continue working on [employer] and exercise.  They will help you control your blood sugar.  Your goal is to work hard and lose approximately 15-20 lbs by your follow up in September!  You can do it!  Continue taking all your other medications as prescribed.  Follow up in September for your A1C and referral for your eye exam.  Let us know if you need a referral to see the Doctors at [facility].   Diabetes and Exercise Regular exercise is important and can help:   Control blood glucose (sugar).   Decrease blood pressure.   Control blood lipids (cholesterol and triglycerides).   Improve overall health.  BENEFITS FROM EXERCISE:  Improved fitness.   Improved flexibility.   Improved endurance.   Increased bone density.   Weight control.   Increased muscle strength.   Decreased body fat.   Improvement of the body's use of a hormone called insulin.   Increased insulin sensitivity.   Reduction of insulin needs.   Helps you feel better.   Reduces stress and tension.  People with diabetes who add exercise to their lifestyle gain additional benefits.   Weight loss.   Reduces appetite.   Improves body's use of blood glucose (sugar).   Decreases risk factors for heart disease:   Lowering of cholesterol and triglycerides.   Raising the level of good cholesterol (high-density lipoproteins [HDL]).   Lowering blood sugar.   Decreases blood pressure.  TYPE 1 DIABETES AND EXERCISE  Exercise will usually lower your blood glucose.   If blood glucose is greater than 240 mg/dl, check urine ketones. If ketones are present, do not exercise.   Location of the insulin injection sites may need to be adjusted with exercise. Avoid injecting insulin into areas of the body that will be exercised. For example, avoid injecting insulin into:   The arms when playing tennis.   The legs when jogging. For more information, discuss this with your caregiver.   Keep a record of:     Food intake.   Type and amount of exercise.   Expected peak times of insulin action.   Blood glucose (sugar) levels.  Do this before, during and after exercise. Review your records with your caregiver(s). This will help you to develop guidelines for adjusting food intake and/or insulin amounts.  TYPE 2 DIABETES AND EXERCISE  Regular physical activity can help control blood glucose.   Exercise is important because it may:   Increase the body's sensitivity to insulin.   Improve blood glucose control.   Exercise reduces the risk of heart disease. It decreases serum cholesterol and triglycerides. It also lowers blood pressure.   Those who take insulin or oral hypoglycemic agents should watch for signs of hypoglycemia. These signs include dizziness, shaking, sweating, chills and confusion.   Body water is lost during exercise. It must be replaced. This will help to avoid loss of body fluids (dehydration) and/or heat stroke.  Be sure to talk to your caregiver before starting an exercise program to make sure it is safe for you. Remember, any activity is better than none.  Document Released: 05/05/2003 Document Re-Released: 12/10/2008 [facility] Patient Information 2011 [place], [place].  Diabetes Meal Planning Guide The diabetes meal planning guide is a tool to help you plan your meals and snacks. It is important for people with diabetes to manage their blood sugar levels. Choosing the right foods and the right amounts throughout your day will help control your blood sugar. Eating right  can even help you improve your blood pressure and reach or maintain a healthy weight. CARBOHYDRATE COUNTING MADE EASY When you eat carbohydrates, they turn to sugar (glucose). This raises your blood sugar level. Counting carbohydrates can help you control this level so you feel better. When you plan your meals by counting carbohydrates, you can have more flexibility in what you eat and balance your medicine  with your food intake. Carbohydrate counting simply means adding up the total amount of carbohydrate grams (g) in your meals or snacks. Try to eat about the same amount at each meal. Foods with carbohydrates are listed below. Each portion below is 1 carbohydrate serving or 15 grams of carbohydrates. Ask your dietician how many grams of carbohydrates you should eat at each meal or snack. Grains and Starches 1 slice bread 1/2 English muffin or hotdog/hamburger bun 3/4 cup cold cereal (unsweetened) 1/3 cup cooked pasta or rice 1/2 cup starchy vegetables (corn, potatoes, peas, beans, winter squash) 1 tortilla (6 inches) 1/4 bagel 1 waffle or pancake (size of a CD) 1/2 cup cooked cereal 4 to 6 small crackers *Whole grain is recommended Fruit 1 cup fresh unsweetened berries, melon, papaya, pineapple 1 small fresh fruit 1/2 banana or mango 1/2 cup fruit juice (4 ounces unsweetened) 1/2 cup canned fruit in natural juice or water 2 tablespoons dried fruit 12 to 15 grapes or cherries Milk and Yogurt 1 cup fat-free or 1% milk 1 cup soy milk 6 ounces light yogurt with sugar-free sweetener 6 ounces low-fat soy yogurt 6 ounces plain yogurt Vegetables 1 cup raw or 1/2 cup cooked is counted as 0 carbohydrates or a "free" food. If you eat 3 or more servings at one meal, count them as 1 carbohydrate serving. Other Carbohydrates 3/4 ounces chips or pretzels 1/2 cup ice cream or frozen yogurt 1/4 cup sherbet or sorbet 2 inch square cake, no frosting 1 tablespoon honey, sugar, jam, jelly, or syrup 2 small cookies 3 squares of graham crackers 3 cups popcorn 6 crackers 1 cup broth-based soup Count 1 cup casserole or other mixed foods as 2 carbohydrate servings. Foods with less than 20 calories in a serving may be counted as 0 carbohydrates or a "free" food. You may want to purchase a book or computer software that lists the carbohydrate gram counts of different foods. In addition, the nutrition  facts panel on the labels of the foods you eat are a good source of this information. The label will tell you how big the serving size is and the total number of carbohydrate grams you will be eating per serving. Divide this number by 15 to obtain the number of carbohydrate servings in a portion. Remember: 1 carbohydrate serving equals 15 grams of carbohydrate. SERVING SIZES Measuring foods and serving sizes helps you make sure you are getting the right amount of food. The list below tells how big or small some common serving sizes are.  1 ounce (oz) of cheese.................................4 stacked dice.   2 to 3 oz cooked meat.................................Marland KitchenDeck of cards.   1 teaspoon (tsp)...........................................Marland KitchenTip of little finger.   1 tablespoon (tbs).......................................Marland KitchenMarland KitchenThumb.   2 tbs............................................................Marland KitchenGolf ball.    cup..........................................................Marland KitchenHalf of a fist.   1 cup...........................................................Marland KitchenA fist.  SAMPLE DIABETES MEAL PLAN Below is a sample meal plan that includes foods from the grain and starches, dairy, vegetable, fruit, and meat groups. A dietician can individualize a meal plan to fit your calorie needs and tell you the number of servings needed from each food group. However, controlling  the total amount of carbohydrates in your meal or snack is more important than making sure you include all of the food groups at every meal. You may interchange carbohydrate containing foods (dairy, starches, and fruits). The meal plan below is an example of a 2000 calorie diet using carbohydrate counting. This meal plan has 17 carbohydrate servings (carb choices). Breakfast 1 cup oatmeal (2 carb choices) 3/4 cup light yogurt (1 carb choice) 1 cup blueberries (1 carb choice) 1/4 cup almonds  Snack 1 large apple (2 carb choices) 1 low-fat  string cheese stick  Lunch Chicken breast salad:  1 cup spinach   1/4 cup chopped tomatoes   2 oz chicken breast, sliced   2 tbs low-fat [place] dressing  12 whole-wheat crackers (2 carb choices) 12 to 15 grapes (1 carb choice) 1 cup low-fat milk (1 carb choice)  Snack 1 cup carrots 1/2 cup hummus (1 carb choice)  Dinner 3 oz broiled salmon 1 cup brown rice (3 carb choices)  Snack 1 1/2 cups steamed broccoli (1 carb choice) drizzled with 1 tsp olive oil and lemon juice 1 cup light pudding (2 carb choices)  DIABETES MEAL PLANNING WORKSHEET Your dietician can use this worksheet to help you decide how many servings of foods and what types of foods are right for you.  Breakfast Food Group and Servings Carb Choices Grain/Starches _______________________________________ Dairy ______________________________________________ Vegetable _______________________________________ Fruit _______________________________________________ Meat _______________________________________________ Fat _____________________________________________ Lunch Food Group and Servings Carb Choices Grain/Starches ________________________________________ Dairy _______________________________________________ Fruit ________________________________________________ Meat ________________________________________________ Fat _____________________________________________ Dinner Food Group and Servings Carb Choices Grain/Starches ________________________________________ Dairy _______________________________________________ Fruit ________________________________________________ Meat ________________________________________________ Fat _____________________________________________ Snacks Food Group and Servings Carb Choices Grain/Starches ________________________________________ Dairy _______________________________________________ Vegetable ________________________________________ Fruit  ________________________________________________ Meat ________________________________________________ Fat _____________________________________________ Daily Totals Starches _________________________ Vegetable __________________________ Fruit ______________________________ Dairy ______________________________ Meat ______________________________ Fat ________________________________  Document Released: 11/09/2004 Document Re-Released: 08/02/2009 [employer] Patient Information 2011 [place], [place].

## 2010-07-11 NOTE — Assessment & Plan Note (Signed)
Lab Results  Component Value Date   HGBA1C 8.5 07/11/2010   HGBA1C 7.2 12/16/2009   HGBA1C 7.9 08/04/2009   Lab Results  Component Value Date   MICROALBUR 5.53* 04/12/2009   LDLCALC 49 03/23/2010   CREATININE 1.03 03/23/2010   A1C is mildly elevated from previous.  He is working to losing weight and states he is taking medications.  We will hold for now on changes in his medications and encourage him to work on his weight loss and exercise.

## 2010-07-11 NOTE — Progress Notes (Signed)
  Subjective:    Patient ID: Glenn Washington, male    DOB: 03-10-1955, 55 y.o.   MRN: [id number]  HPI  Glenn Washington is a 55 year old man who presents to clinic today for follow up of his chronic medical conditions including HTN, DM, and HLD.  He states that he has been taking his medications and has no problems with side effects.  He also needs refills of his medications.    He is due for several health maintenance items including HgbA1C, urine microalbumin/creatinine ratio, and immunizations.  He can't remember the last time he had a tetanus and doesn't think he has ever had a pneumonia shot.  Looking through hospital records and centricity he does not have a record of either.    He is down approximately 8 lbs from his last visit and has been working on diet and exercise but does admit that he sometimes doesn't do the best job of watching what he eats.    Review of Systems    Constitutional: Denies fever, chills, diaphoresis, appetite change and fatigue.  HEENT: Denies photophobia, eye pain, redness, hearing loss, ear pain, congestion, sore throat, rhinorrhea, sneezing, mouth sores, trouble swallowing, neck pain, neck stiffness and tinnitus.   Respiratory: Denies SOB, DOE, cough, chest tightness,  and wheezing.   Cardiovascular: Denies chest pain, palpitations and leg swelling.  Gastrointestinal: Denies nausea, vomiting, abdominal pain, diarrhea, constipation, blood in stool and abdominal distention.  Genitourinary: Denies dysuria, urgency, frequency, hematuria, flank pain and difficulty urinating.  Musculoskeletal: Denies myalgias, back pain, joint swelling, arthralgias and gait problem.  Skin: Denies pallor, rash and wound.  Neurological: Denies dizziness, seizures, syncope, weakness, light-headedness, numbness and headaches.  Hematological: Denies adenopathy. Easy bruising, personal or family bleeding history  Psychiatric/Behavioral: Denies suicidal ideation, mood changes, confusion,  nervousness, sleep disturbance and agitation  Objective:   Physical Exam    Constitutional: Vital signs reviewed.  Patient is a well-developed and obese man in no acute distress and cooperative with exam. Alert and oriented x3.  Head: Normocephalic and atraumatic Ear: TM normal bilaterally Mouth: no erythema or exudates, MMM, poor dentition.  Eyes: PERRL, EOMI, conjunctivae normal, No scleral icterus.  Neck: Supple, Trachea midline normal ROM, No JVD, mass, thyromegaly, or carotid bruit present.  Cardiovascular: RRR, S1 normal, S2 normal, no MRG, pulses symmetric and intact bilaterally Pulmonary/Chest: CTAB, no wheezes, rales, or rhonchi Abdominal: Soft. Non-tender, non-distended, bowel sounds are normal, no masses, organomegaly, or guarding present.  GU: no CVA tenderness Musculoskeletal: No joint deformities, erythema, or stiffness, ROM full and no nontender Hematology: no cervical, inginal, or axillary adenopathy.  Neurological: A&O x3, Strength is normal and symmetric bilaterally, cranial nerve II-XII are grossly intact, no focal motor deficit, sensory intact to light touch bilaterally.  Skin: Warm, dry and intact. No rash, cyanosis, or clubbing.  Psychiatric: Normal mood and affect. speech and behavior is normal. Judgment and thought content normal. Cognition and memory are normal.   Assessment & Plan:

## 2010-07-11 NOTE — Assessment & Plan Note (Signed)
BP Readings from Last 3 Encounters:  07/11/10 120/80  03/23/10 129/87  03/03/10 144/86   His blood pressure today is excellent.  We will continue on his current medications at this time and he will be due for monitoring blood tests in September when he comes for his diabetes follow up.

## 2010-07-11 NOTE — Assessment & Plan Note (Signed)
Lab Results  Component Value Date   CHOL 103 03/23/2010   CHOL 138 05/16/2009   CHOL 158 03/23/2008   Lab Results  Component Value Date   HDL 42 03/23/2010   HDL 53 03/28/8655   HDL 50 8/46/9629   Lab Results  Component Value Date   LDLCALC 49 03/23/2010   LDLCALC 71 05/16/2009   LDLCALC 90 03/23/2008   Lab Results  Component Value Date   TRIG 62 03/23/2010   TRIG 71 05/16/2009   TRIG 88 03/23/2008   Lab Results  Component Value Date   CHOLHDL 2.5 Ratio 03/23/2010   CHOLHDL 2.6 Ratio 05/16/2009   CHOLHDL 3.2 Ratio 03/23/2008   No results found for this basename: LDLDIRECT   He has been well controlled and has had no major changes in weight or diet over the last few months so we will continue his current medication dose and follow his FLP yearly.

## 2010-07-12 LAB — MICROALBUMIN / CREATININE URINE RATIO: Microalb Creat Ratio: 18.7 mg/g (ref 0.0–30.0)

## 2010-07-14 NOTE — Consult Note (Signed)
NAMECONG, HIGHTOWER                            ACCOUNT NO.:  [id number]   MEDICAL RECORD NO.:  [id number]                   PATIENT TYPE:  REC   LOCATION:  FOOT                                 FACILITY[facility]  [facility]   PHYSICIAN:  Jonelle Sports. Sevier, M.D.              DATE OF BIRTH:  May 25, 1955   DATE OF CONSULTATION:  11/11/2003  DATE OF DISCHARGE:                                   CONSULTATION   HISTORY:  This 55 year old black male was referred at the courtesy of Dr.  Lavera Guise for assistance with management of painful calluses of the feet  bilaterally.   The patient has had type 2 diabetes for approximately 15 years and by his  own admission has never taken adequate care of it with recent hemoglobin A1c  being 13.1% despite the combination of glipizide and metformin therapy.   He has not had previous difficulty with his feet until recent months when he  has noted the progressive development of multiple painful calluses on the  plantar aspects of the feet bilaterally.  He has had no recognized  ulcerations or drainages.  He is referred here today for our consultation  and our management of these calluses.   PAST MEDICAL HISTORY:  Past medical history is notable also for hypertension  and hyperlipidemia.  So, it seems likely that the patient has the so called  metabolic syndrome.   ALLERGIES:  He has no known medicinal allergies.   MEDICATIONS:  His regular medications include glipizide 10 mg daily,  metformin 500 mg b.i.d., hydrochlorothiazide, 37.5 daily, Zocor 40 mg daily,  aspirin 325 mg daily.   PHYSICAL EXAMINATION:  Examination today is limited to the distal lower  extremities.  The patient's feet are free of edema and without gross  deformity.  His skin temperatures are high normal, but are symmetrical.  All  pulses are palpable and adequate.  Monofilament testing shows that he has  protective sensation throughout both feet.  His toenails are grossly unkempt  and there is evidence  of pressure discoloration on the dorsal aspects of  both halluxes and a small callus at the dorsal interphalangeal joint of the  fourth toe on the right.   There are dense calluses at the tips of the second through the fourth toes  bilaterally, and also medial and plantar calluses at the interphalangeal  joints of the halluxes bilaterally.   From the plantar aspects of the feet, there are extremely heavy calluses  with cores at the second, third and fifth metatarsal head areas on the right  and at the second, third and fifth metatarsal areas on the left.  There is  also significant plantar callus at both heels.   IMPRESSION:  Diabetes mellitus type 2, uncontrolled with multiple plantar  calluses.   DISPOSITION:  1.  Discussed with the patient the urgent need for him to bring his diabetes  under better control.  He takes a very helpless view of this, readily      admitting that it is his own indiscretions which caused this poor      control, but seems almost powerless to do anything about it.  He is      encouraged to continue to work at this with his primary care physician,      to consider a repeat of diabetes classes and perhaps a support group,      and also to not reject to the use of insulin should his physician      recommend that to bring this diabetes under better control.  2.  The patient's footwear was assessed and appears to be essentially      adequate.  3.  The calluses at the tips of the second through fourth toes bilaterally      are sharply pared without incident.  4.  The calluses underlying the second, third and fifth metatarsal heads on      both feet are deeply pared, and there are cores which can only be      partially removed in several of these.  5.  Calluses on the plantar aspects of both heels are dremeled without      incident as are the medial, lateral and posterior aspects of the heels.  6.  The patient's nails will be reduced by the nurse.  7.  The  patient is given Bag balm to use on all aspects of his feet.  8.  The calluses underlying the metatarsal heads are all painted with 15%      salicylic acid and collodion following their paring.  9.  The patient will be seen in follow up in one month with anticipation      that additional reduction of calluses will be necessary at that time.      Once his calluses are stable, he will be placed in routine nail care      follow up on a three month basis.                                               Jonelle Sports. Cheryll Cockayne, M.D.    RES/MEDQ  D:  11/11/2003  T:  11/11/2003  Job:  [id number]   cc:   Lonia Blood, M.D.  [address], [place] [address]  Fax: [phone]

## 2010-08-07 ENCOUNTER — Encounter: Payer: Self-pay | Admitting: Internal Medicine

## 2010-09-15 NOTE — Progress Notes (Signed)
Addended by: Hassan Buckler on: 09/15/2010 03:34 PM   Modules accepted: Orders

## 2010-10-26 ENCOUNTER — Emergency Department (HOSPITAL_COMMUNITY)
Admission: EM | Admit: 2010-10-26 | Discharge: 2010-10-26 | Disposition: A | Discharge status: Home / Self Care | Payer: Self-pay | Attending: Emergency Medicine | Admitting: Emergency Medicine

## 2010-10-26 DIAGNOSIS — I10 Essential (primary) hypertension: Secondary | ICD-10-CM | POA: Insufficient documentation

## 2010-10-26 DIAGNOSIS — E119 Type 2 diabetes mellitus without complications: Secondary | ICD-10-CM | POA: Insufficient documentation

## 2010-10-26 DIAGNOSIS — E7800 Pure hypercholesterolemia, unspecified: Secondary | ICD-10-CM | POA: Insufficient documentation

## 2010-10-26 DIAGNOSIS — Z79899 Other long term (current) drug therapy: Secondary | ICD-10-CM | POA: Insufficient documentation

## 2010-10-26 DIAGNOSIS — L02519 Cutaneous abscess of unspecified hand: Secondary | ICD-10-CM | POA: Insufficient documentation

## 2010-10-26 DIAGNOSIS — M79609 Pain in unspecified limb: Secondary | ICD-10-CM | POA: Insufficient documentation

## 2010-10-26 DIAGNOSIS — M7989 Other specified soft tissue disorders: Secondary | ICD-10-CM | POA: Insufficient documentation

## 2010-10-26 LAB — GLUCOSE, CAPILLARY: Glucose-Capillary: 256 mg/dL — ABNORMAL HIGH (ref 70–99)

## 2010-10-28 ENCOUNTER — Inpatient Hospital Stay (INDEPENDENT_AMBULATORY_CARE_PROVIDER_SITE_OTHER)
Admission: RE | Admit: 2010-10-28 | Discharge: 2010-10-28 | Disposition: A | Discharge status: Home / Self Care | Payer: Self-pay | Source: Ambulatory Visit | Attending: Family Medicine | Admitting: Family Medicine

## 2010-10-28 DIAGNOSIS — L03119 Cellulitis of unspecified part of limb: Secondary | ICD-10-CM

## 2010-10-30 ENCOUNTER — Inpatient Hospital Stay (INDEPENDENT_AMBULATORY_CARE_PROVIDER_SITE_OTHER)
Admission: RE | Admit: 2010-10-30 | Discharge: 2010-10-30 | Disposition: A | Discharge status: Home / Self Care | Payer: Self-pay | Source: Ambulatory Visit | Attending: Family Medicine | Admitting: Family Medicine

## 2010-10-30 DIAGNOSIS — L03119 Cellulitis of unspecified part of limb: Secondary | ICD-10-CM

## 2010-12-09 ENCOUNTER — Emergency Department (HOSPITAL_COMMUNITY)
Admission: EM | Admit: 2010-12-09 | Discharge: 2010-12-09 | Disposition: A | Discharge status: Home / Self Care | Payer: Self-pay | Attending: Emergency Medicine | Admitting: Emergency Medicine

## 2010-12-09 DIAGNOSIS — R51 Headache: Secondary | ICD-10-CM | POA: Insufficient documentation

## 2010-12-09 DIAGNOSIS — L02818 Cutaneous abscess of other sites: Secondary | ICD-10-CM | POA: Insufficient documentation

## 2010-12-09 DIAGNOSIS — I10 Essential (primary) hypertension: Secondary | ICD-10-CM | POA: Insufficient documentation

## 2010-12-09 DIAGNOSIS — E789 Disorder of lipoprotein metabolism, unspecified: Secondary | ICD-10-CM | POA: Insufficient documentation

## 2010-12-09 DIAGNOSIS — Z79899 Other long term (current) drug therapy: Secondary | ICD-10-CM | POA: Insufficient documentation

## 2010-12-09 DIAGNOSIS — E119 Type 2 diabetes mellitus without complications: Secondary | ICD-10-CM | POA: Insufficient documentation

## 2010-12-11 ENCOUNTER — Inpatient Hospital Stay (INDEPENDENT_AMBULATORY_CARE_PROVIDER_SITE_OTHER)
Admission: RE | Admit: 2010-12-11 | Discharge: 2010-12-11 | Disposition: A | Discharge status: Home / Self Care | Payer: Self-pay | Source: Ambulatory Visit | Attending: Family Medicine | Admitting: Family Medicine

## 2010-12-11 DIAGNOSIS — L02818 Cutaneous abscess of other sites: Secondary | ICD-10-CM

## 2011-02-05 ENCOUNTER — Encounter: Payer: Self-pay | Admitting: Internal Medicine

## 2011-02-08 ENCOUNTER — Encounter: Payer: Self-pay | Admitting: Internal Medicine

## 2011-02-08 ENCOUNTER — Ambulatory Visit (INDEPENDENT_AMBULATORY_CARE_PROVIDER_SITE_OTHER): Payer: Self-pay | Admitting: Internal Medicine

## 2011-02-08 VITALS — BP 135/84 | HR 80 | Temp 97.2°F | Ht 75.0 in | Wt 257.7 lb

## 2011-02-08 DIAGNOSIS — F32A Depression, unspecified: Secondary | ICD-10-CM

## 2011-02-08 DIAGNOSIS — E119 Type 2 diabetes mellitus without complications: Secondary | ICD-10-CM

## 2011-02-08 DIAGNOSIS — F329 Major depressive disorder, single episode, unspecified: Secondary | ICD-10-CM | POA: Insufficient documentation

## 2011-02-08 DIAGNOSIS — E785 Hyperlipidemia, unspecified: Secondary | ICD-10-CM

## 2011-02-08 DIAGNOSIS — Z23 Encounter for immunization: Secondary | ICD-10-CM

## 2011-02-08 DIAGNOSIS — I10 Essential (primary) hypertension: Secondary | ICD-10-CM

## 2011-02-08 LAB — POCT GLYCOSYLATED HEMOGLOBIN (HGB A1C): Hemoglobin A1C: 9.2

## 2011-02-08 MED ORDER — GLIPIZIDE ER 10 MG PO TB24: 10.0000 mg | ORAL_TABLET | Freq: Every day | ORAL | Status: DC

## 2011-02-08 MED ORDER — SIMVASTATIN 40 MG PO TABS: 40.0000 mg | ORAL_TABLET | Freq: Every day | ORAL | Status: DC

## 2011-02-08 MED ORDER — LISINOPRIL 40 MG PO TABS: 80.0000 mg | ORAL_TABLET | Freq: Every day | ORAL | Status: DC

## 2011-02-08 MED ORDER — ZOLPIDEM TARTRATE 5 MG PO TABS: 5.0000 mg | ORAL_TABLET | Freq: Every evening | ORAL | Status: DC | PRN

## 2011-02-08 MED ORDER — FLUOXETINE HCL 20 MG PO TABS: 20.0000 mg | ORAL_TABLET | Freq: Every day | ORAL | Status: DC

## 2011-02-08 MED ORDER — HYDROCHLOROTHIAZIDE 25 MG PO TABS: 25.0000 mg | ORAL_TABLET | Freq: Every day | ORAL | Status: DC

## 2011-02-08 MED ORDER — METFORMIN HCL ER 500 MG PO TB24: 2000.0000 mg | ORAL_TABLET | Freq: Every day | ORAL | Status: DC

## 2011-02-08 MED ORDER — CLONIDINE HCL 0.1 MG PO TABS: 0.1000 mg | ORAL_TABLET | Freq: Two times a day (BID) | ORAL | Status: DC

## 2011-02-08 NOTE — Patient Instructions (Signed)
Depression, Adolescent and Adult Depression is a true and treatable medical condition. In general there are two kinds of depression:  Depression we all experience in some form. For example depression from the death of a loved one, financial distress or natural disasters will trigger or increase depression.   Clinical depression, on the other hand, appears without an apparent cause or reason. This depression is a disease. Depression may be caused by chemical imbalance in the body and brain or may come as a response to a physical illness. Alcohol and other drugs can cause depression.  DIAGNOSIS  The diagnosis of depression is usually based upon symptoms and medical history. TREATMENT  Treatments for depression fall into three categories. These are:  Drug therapy. There are many medicines that treat depression. Responses may vary and sometimes trial and error is necessary to determine the best medicines and dosage for a particular patient.   Psychotherapy, also called talking treatments, helps people resolve their problems by looking at them from a different point of view and by giving people insight into their own personal makeup. Traditional psychotherapy looks at a childhood source of a problem. Other psychotherapy will look at current conflicts and move toward solving those. If the cause of depression is drug use, counseling is available to help abstain. In time the depression will usually improve. If there were underlying causes for the chemical use, they can be addressed.  Treat all depression or suicide threats as serious. Obtain professional help. Do not wait to see if serious depression will get better over time without help. Seek help for yourself or those around you. In the [place] the number to the National Suicide Help Lines With 24 Hour Help Are: 1-800-SUICIDE 867-579-0946 Document Released: 02/10/2000 Document Revised: 10/25/2010 Document Reviewed: 10/01/2007 [facility] Patient  Information 2012 [employer].  1.  Start Zolpidem (Ambien) 5 mg tablets.  Take 1 tablet right before bedtime.  2.  Start Prozac (Fluoxetine) 20 mg tablets.  Take 1 tablet daily.  If it makes you sleepy take it at night.  If it wakes you up take it in the morning.  3. If you have any feelings of hurting yourself, or others, or find that you are feeling more down. Please call us in the clinic at anytime.  4..  Make sure you are taking all of your other medications as directed.  5.  Follow up with me in January to see how you are doing.

## 2011-02-08 NOTE — Progress Notes (Signed)
Subjective:   Patient ID: Glenn Washington male   DOB: Dec 05, 1955 55 y.o.   MRN: [id number]  HPI: [name] is a 55 y.o. man who presents to clinic today for routine follow up of his chronic medical conditions including diabetes, hypertension, and hyperlipidemia.  He states that he has been missing some of the doses of his medications and that he has been out of a few of them for a week or so.  He denies headaches, increased thirst, polyuria, vision changes, chest pain, SOB, or swelling.  He has noticed that he is losing weight.  He would also like a flu shot today.   He states that the reason he has not been taking his medications as consistently is because about 5 months ago his long term girlfriend left him and he has been struggling with that.  He states that he has been feeling down and has found that he has been drinking alcohol more then previously.  He states that his sleep is "awful" with problems falling asleep as well as staying asleep.  He states that he has no appetite and is not eating more then once daily.  He acknowledges that he has not been taking his medication because of his mood.  He has not been leaving the house as much as he used to.  He states that when his girlfriend first left that he had some thoughts of suicide but he never had a plan of how he would do it.  He states that he has no current thoughts of hurting himself or others. He has never had a problem with depression before but thinks his mom did.  He does not know if she was on medication.   Past Medical History  Diagnosis Date  . Diabetes mellitus   . Hypertension   . Hyperlipidemia   . Morbid obesity   . Constipation   . History of chest pain    Current Outpatient Prescriptions  Medication Sig Dispense Refill  . amLODipine (NORVASC) 10 MG tablet Take 1 tablet (10 mg total) by mouth daily.  90 tablet  4  . aspirin 81 MG tablet Take 81 mg by mouth daily.        . Blood Glucose Monitoring Suppl (TRUE TRACK  BLOOD GLUCOSE) DEVI 1 each by Does not apply route 2 (two) times daily.        . cloNIDine (CATAPRES) 0.1 MG tablet Take 1 tablet (0.1 mg total) by mouth 2 (two) times daily.  60 tablet  6  . glipiZIDE (GLUCOTROL) 10 MG 24 hr tablet Take 1 tablet (10 mg total) by mouth daily.  30 tablet  6  . glucose blood test strip 1 each by Other route as needed. Use as instructed       . hydrochlorothiazide 25 MG tablet Take 1 tablet (25 mg total) by mouth daily.  30 tablet  6  . LANCETS ULTRA THIN 30G MISC 1 each by Does not apply route 2 (two) times daily.        Marland Kitchen lisinopril (PRINIVIL,ZESTRIL) 40 MG tablet Take 2 tablets (80 mg total) by mouth daily.  60 tablet  6  . metFORMIN (GLUCOPHAGE-XR) 500 MG 24 hr tablet Take 4 tablets (2,000 mg total) by mouth daily with breakfast.  120 tablet  6  . polyethylene glycol (MIRALAX / GLYCOLAX) packet Take 17 g by mouth daily.        . simvastatin (ZOCOR) 40 MG tablet Take 1 tablet (40 mg total) by  mouth at bedtime.  30 tablet  6   Family History  Problem Relation Age of Onset  . Prostate cancer Neg Hx    History   Social History  . Marital Status: Single    Spouse Name: N/A    Number of Children: N/A  . Years of Education: N/A   Social History Main Topics  . Smoking status: Former Games developer  . Smokeless tobacco: None  . Alcohol Use: None  . Drug Use: Yes     uses marijuana occasionally.  Past cocaine use but currently clean  . Sexually Active: None   Other Topics Concern  . None   Social History Narrative   Single, 1 daughter and 4 grandsons in good health.  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   Review of Systems: Constitutional: Denies fever, chills, diaphoresis, appetite change and fatigue.  HEENT: Denies photophobia, eye pain, redness, hearing loss, ear pain, congestion, sore throat, rhinorrhea, sneezing, mouth sores, trouble swallowing, neck pain, neck stiffness and tinnitus.   Respiratory: Denies SOB, DOE, cough, chest tightness,   and wheezing.   Cardiovascular: Denies chest pain, palpitations and leg swelling.  Gastrointestinal: Denies nausea, vomiting, abdominal pain, diarrhea, constipation, blood in stool and abdominal distention.  Genitourinary: Denies dysuria, urgency, frequency, hematuria, flank pain and difficulty urinating.  Musculoskeletal: Denies myalgias, back pain, joint swelling, arthralgias and gait problem.  Skin: Denies pallor, rash and wound.  Neurological: Denies dizziness, seizures, syncope, weakness, light-headedness, numbness and headaches.  Hematological: Denies adenopathy. Easy bruising, personal or family bleeding history  Psychiatric/Behavioral: Positive for suicidal ideation, mood changes, and sleep disturbance Negative for confusion, nervousness, hallucinations, or increased agitation  Objective:  Physical Exam: Filed Vitals:   02/08/11 0843  BP: 135/84  Pulse: 80  Temp: 97.2 F (36.2 C)  TempSrc: Oral  Height: 6\' 3"  (1.905 m)  Weight: 257 lb 11.2 oz (116.892 kg)   Constitutional: Vital signs reviewed.  Patient is a well-developed and well-nourished man in no acute distress and cooperative with exam. Alert and oriented x3. He is tearful throughout the visit with a flat affect.   Head: Normocephalic and atraumatic Ear: TM normal bilaterally Mouth: no erythema or exudates, MMM Eyes: PERRL, EOMI, conjunctivae normal, No scleral icterus.  Neck: Supple, Trachea midline normal ROM, No JVD, mass, thyromegaly, or carotid bruit present.  Cardiovascular: RRR, S1 normal, S2 normal, no MRG, pulses symmetric and intact bilaterally Pulmonary/Chest: CTAB, no wheezes, rales, or rhonchi Abdominal: Soft. Non-tender, non-distended, bowel sounds are normal, no masses, organomegaly, or guarding present.  GU: no CVA tenderness Musculoskeletal: No joint deformities, erythema, or stiffness, ROM full and no nontender Hematology: no cervical, inginal, or axillary adenopathy.  Neurological: A&O x3, Strenght  is normal and symmetric bilaterally, cranial nerve II-XII are grossly intact, no focal motor deficit, sensory intact to light touch bilaterally.  Skin: Warm, dry and intact. No rash, cyanosis, or clubbing.  Psychiatric: Depressed mood and tearful affect. speech and behavior is normal. Judgment, insight, and thought content normal. Cognition and memory are normal.   Assessment & Plan:

## 2011-03-08 NOTE — Assessment & Plan Note (Addendum)
Lab Results  Component Value Date   HGBA1C 9.2 02/08/2011   HGBA1C 8.5 07/11/2010   HGBA1C 7.2 12/16/2009   Lab Results  Component Value Date   MICROALBUR 5.88* 07/11/2010   LDLCALC 49 03/23/2010   CREATININE 1.03 03/23/2010   His A1C is increased from his last visit likely secondary to poor diet and poor compliance from his depression.  I encouraged him to continue taking the medication as prescribed that with the improvement in his mood it would help all of his other problems. He is due for a eye exam so we will refer him to the opthomologist.  He has a history of retinopathy.

## 2011-03-08 NOTE — Assessment & Plan Note (Signed)
Lab Results  Component Value Date   NA 135 03/23/2010   K 4.3 03/23/2010   CL 101 03/23/2010   CO2 23 03/23/2010   BUN 19 03/23/2010   CREATININE 1.03 03/23/2010    BP Readings from Last 3 Encounters:  02/08/11 135/84  07/11/10 120/80  03/23/10 129/87    Assessment: Hypertension control:  mildly elevated  Progress toward goals:  deteriorated Barriers to meeting goals:  nonadherence to medications and His depression  Plan: Hypertension treatment:  continue current medications We will refill his medications today and continue to follow up as we treat his depression and his medication compliance increases.

## 2011-03-08 NOTE — Assessment & Plan Note (Signed)
He is clinically depressed and has been for several months.  It is related to his girlfriend leaving.  We spent approximately 30 minutes talking about depression and the treatment including therapy as well as medications.  He states that he would like to try the medication for now.  We will start him on Prozac and Ambien to help his mood and his sleep since his insomnia is a major component of his depression.  We discussed that the Prozac can take from 6-8 weeks to see the effects of the medication and to be patient.  We will see him back in 4 weeks to see how he is doing and if he is still not having much response we will increase the dose of the prozac to 40 mg daily.  He was counseled to call immediately if he has thoughts of hurting himself or others or if his depression gets worse.  He states that he will and he contracts for safety.

## 2011-03-08 NOTE — Assessment & Plan Note (Signed)
Lab Results  Component Value Date   CHOL 103 03/23/2010   CHOL 138 05/16/2009   CHOL 158 03/23/2008   Lab Results  Component Value Date   HDL 42 03/23/2010   HDL 53 1/61/0960   HDL 50 4/54/0981   Lab Results  Component Value Date   LDLCALC 49 03/23/2010   LDLCALC 71 05/16/2009   LDLCALC 90 03/23/2008   Lab Results  Component Value Date   TRIG 62 03/23/2010   TRIG 71 05/16/2009   TRIG 88 03/23/2008   Lab Results  Component Value Date   CHOLHDL 2.5 Ratio 03/23/2010   CHOLHDL 2.6 Ratio 05/16/2009   CHOLHDL 3.2 Ratio 03/23/2008   No results found for this basename: LDLDIRECT   He is due for a recheck of his cholesterol but he has not been taking his medication so we will hold off until he has been taking it for a few more weeks.  His LDL is well controlled either way.
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
## 2011-09-12 ENCOUNTER — Encounter: Payer: Self-pay | Admitting: Internal Medicine

## 2011-09-18 ENCOUNTER — Ambulatory Visit (INDEPENDENT_AMBULATORY_CARE_PROVIDER_SITE_OTHER): Payer: Self-pay | Admitting: Internal Medicine

## 2011-09-18 ENCOUNTER — Encounter: Payer: Self-pay | Admitting: Internal Medicine

## 2011-09-18 VITALS — BP 143/96 | HR 84 | Temp 97.4°F | Ht 72.0 in | Wt 268.3 lb

## 2011-09-18 DIAGNOSIS — Z0000 Encounter for general adult medical examination without abnormal findings: Secondary | ICD-10-CM

## 2011-09-18 DIAGNOSIS — Z79899 Other long term (current) drug therapy: Secondary | ICD-10-CM

## 2011-09-18 DIAGNOSIS — E785 Hyperlipidemia, unspecified: Secondary | ICD-10-CM

## 2011-09-18 DIAGNOSIS — E119 Type 2 diabetes mellitus without complications: Secondary | ICD-10-CM

## 2011-09-18 DIAGNOSIS — I10 Essential (primary) hypertension: Secondary | ICD-10-CM

## 2011-09-18 LAB — BASIC METABOLIC PANEL
BUN: 17 mg/dL (ref 6–23)
Calcium: 9.6 mg/dL (ref 8.4–10.5)
Chloride: 99 mEq/L (ref 96–112)
Creat: 0.92 mg/dL (ref 0.50–1.35)

## 2011-09-18 LAB — POCT GLYCOSYLATED HEMOGLOBIN (HGB A1C): Hemoglobin A1C: 10.7

## 2011-09-18 LAB — LIPID PANEL
LDL Cholesterol: 141 mg/dL — ABNORMAL HIGH (ref 0–99)
Triglycerides: 144 mg/dL (ref <150)

## 2011-09-18 LAB — GLUCOSE, CAPILLARY: Glucose-Capillary: 313 mg/dL — ABNORMAL HIGH (ref 70–99)

## 2011-09-18 MED ORDER — GLIPIZIDE ER 10 MG PO TB24: 20.0000 mg | ORAL_TABLET | Freq: Every day | ORAL | Status: DC

## 2011-09-18 MED ORDER — SIMVASTATIN 40 MG PO TABS: 20.0000 mg | ORAL_TABLET | Freq: Every day | ORAL | Status: DC

## 2011-09-18 MED ORDER — METFORMIN HCL ER 500 MG PO TB24: 2000.0000 mg | ORAL_TABLET | Freq: Every day | ORAL | Status: DC

## 2011-09-18 MED ORDER — SIMVASTATIN 20 MG PO TABS: 20.0000 mg | ORAL_TABLET | Freq: Every day | ORAL | Status: DC

## 2011-09-18 NOTE — Assessment & Plan Note (Addendum)
Pending lipid panel, nonfasting Last lipid panel Lipid Panel     Component Value Date/Time   CHOL 225* 09/18/2011 1124   TRIG 144 09/18/2011 1124   HDL 55 09/18/2011 1124   CHOLHDL 4.1 09/18/2011 1124   VLDL 29 09/18/2011 1124   LDLCALC 141* 09/18/2011 1124   LDL was not at goal  Pt to cont. Zocor (does not need refills currently) but will decrease dose to 20 mg qd because pt was previously on Norvasc but not currently taking.   There are DDI w/ these two medications so Zocor dose should be reduced in case Norvasc is added in the future to control BP  Plan Decrease Zocor to 20 mg qhs RF x 11 for future filling

## 2011-09-18 NOTE — Progress Notes (Signed)
  Subjective:    Patient ID: Glenn Washington, male    DOB: [birth year], 56 y.o.   MRN: [id number]  HPI Comments: 56 y.o PMH uncontrolled DM 2, HLD, obesity, HTN, constipation presents for health maintenance f/u. Denies complaints today.  Reports diet noncompliance with h/o DM 2 (drinking a lot of gatorade and eating oreos) and later reports medication noncompliance.  Pt states BP elevated today b/c he only took Clonidine 0.1 mg one dose this am, not Lisinopril of HCTZ. Pt needs Rx refills Glipizide 10 mg ER.      Review of Systems  Constitutional: Negative for fever, appetite change and unexpected weight change.       +increased thirst  Respiratory: Negative for shortness of breath.   Cardiovascular: Negative for chest pain and leg swelling.  Gastrointestinal:       Denies ab pain, denies constipation, denies blood per rectum  Genitourinary: Positive for frequency. Negative for hematuria.       Objective:   Physical Exam  Nursing note and vitals reviewed. Constitutional: He is oriented to person, place, and time. He appears well-developed and well-nourished. He is cooperative. No distress.       Elevated BP x 2  HENT:  Head: Normocephalic and atraumatic.  Mouth/Throat: Oropharynx is clear and moist and mucous membranes are normal. Abnormal dentition. No oropharyngeal exudate.  Eyes: Conjunctivae are normal. Pupils are equal, round, and reactive to light. No scleral icterus.  Cardiovascular: Normal rate, regular rhythm, S1 normal, S2 normal, normal heart sounds and normal pulses.   No murmur heard. Pulses:      Dorsalis pedis pulses are 2+ on the right side, and 2+ on the left side.       Posterior tibial pulses are 2+ on the right side, and 2+ on the left side.  Pulmonary/Chest: Effort normal and breath sounds normal. No respiratory distress. He has no wheezes.  Abdominal: Soft. Bowel sounds are normal. He exhibits no distension. There is no tenderness.    Neurological: He is alert  and oriented to person, place, and time. Gait normal.  Skin: Skin is warm and dry. No rash noted.       Clavi (plural for calluses) b/l feet no ulceration  Psychiatric: He has a normal mood and affect. His speech is normal and behavior is normal. He does not exhibit a depressed mood.          Assessment & Plan:  F/u 1 month (HTN)-considered adding Norvasc back if uncontrolled

## 2011-09-18 NOTE — Assessment & Plan Note (Signed)
Colonoscopy due 2015 Pt up to date pneumococcal vx and Td vx

## 2011-09-18 NOTE — Patient Instructions (Addendum)
Follow up 1-2 months Take your Glipizide ER at a higher dose 20 mg daily Bring your meter and medications at the next visit thanks

## 2011-09-18 NOTE — Assessment & Plan Note (Addendum)
Uncontrolled, Ha1c 10.7 today (trending up) prob 2/2 noncompliance, fsbs 313 Pt declines sq insulin at this time which is suggested to offer better control  Plan Pt spoke with Gavin Pound today Will increase Glipizide ER to 20 mg qd, Cont. Metformin XR 2000 mg q (max dose) Ordered BMP, urine albumin/creatinine Referred for eye exam Dr. Hyacinth Meeker [facility] location) Advised to bring meter and medications at the next visit

## 2011-09-18 NOTE — Assessment & Plan Note (Signed)
144/91 with repeat 143/96 Uncontrolled today Pt pt he only took clonidine 0.1 mg (one dose so far today), not Lisinopril-HCTZ  today Questionable compliance  Plan F/u 1 month BP if still elevated add Norvasc 5 mg qd

## 2011-09-19 ENCOUNTER — Encounter: Payer: Self-pay | Admitting: Internal Medicine

## 2011-09-19 LAB — MICROALBUMIN / CREATININE URINE RATIO: Microalb Creat Ratio: 30 mg/g (ref 0.0–30.0)

## 2011-09-19 MED ORDER — SIMVASTATIN 20 MG PO TABS: 20.0000 mg | ORAL_TABLET | Freq: Every evening | ORAL | Status: AC

## 2011-09-19 NOTE — Progress Notes (Signed)
I saw patient and discussed his care with resident Dr. McLean.  I agree with the clinical findings and plans as outlined in her note. 

## 2011-09-19 NOTE — Addendum Note (Signed)
Addended by: Annett Gula on: 09/19/2011 03:16 PM   Modules accepted: Orders

## 2011-11-15 ENCOUNTER — Ambulatory Visit: Payer: Self-pay | Admitting: Internal Medicine

## 2011-11-21 ENCOUNTER — Encounter: Payer: Self-pay | Admitting: Internal Medicine

## 2011-11-21 ENCOUNTER — Ambulatory Visit (INDEPENDENT_AMBULATORY_CARE_PROVIDER_SITE_OTHER): Payer: Self-pay | Admitting: Internal Medicine

## 2011-11-21 VITALS — BP 104/75 | HR 73 | Temp 98.0°F | Ht 73.4 in | Wt 271.9 lb

## 2011-11-21 DIAGNOSIS — E785 Hyperlipidemia, unspecified: Secondary | ICD-10-CM

## 2011-11-21 DIAGNOSIS — Z23 Encounter for immunization: Secondary | ICD-10-CM

## 2011-11-21 DIAGNOSIS — I10 Essential (primary) hypertension: Secondary | ICD-10-CM

## 2011-11-21 DIAGNOSIS — E119 Type 2 diabetes mellitus without complications: Secondary | ICD-10-CM

## 2011-11-21 DIAGNOSIS — E11329 Type 2 diabetes mellitus with mild nonproliferative diabetic retinopathy without macular edema: Secondary | ICD-10-CM

## 2011-11-21 LAB — GLUCOSE, CAPILLARY: Glucose-Capillary: 244 mg/dL — ABNORMAL HIGH (ref 70–99)

## 2011-11-21 NOTE — Progress Notes (Signed)
Subjective:   Patient ID: Glenn Washington male   DOB: 03-01-1955 56 y.o.   MRN: [id number]  HPI: [name] is a 56 y.o. man who presents to clinic today for follow up on his chronic medical problems including hypertension, hyperlipidemia, and diabetes.  See Problem focused Assessment and Plan for full details of his chronic medical conditions.   Past Medical History  Diagnosis Date  . Diabetes mellitus   . Hypertension   . Hyperlipidemia   . Morbid obesity   . Constipation   . History of chest pain    Current Outpatient Prescriptions  Medication Sig Dispense Refill  . amLODipine (NORVASC) 10 MG tablet Take 1 tablet (10 mg total) by mouth daily.  90 tablet  4  . aspirin 81 MG tablet Take 81 mg by mouth daily.        . Blood Glucose Monitoring Suppl (TRUE TRACK BLOOD GLUCOSE) DEVI 1 each by Does not apply route 2 (two) times daily.        . cloNIDine (CATAPRES) 0.1 MG tablet Take 1 tablet (0.1 mg total) by mouth 2 (two) times daily.  60 tablet  6  . FLUoxetine (PROZAC) 20 MG tablet Take 1 tablet (20 mg total) by mouth daily.  30 tablet  2  . glipiZIDE (GLUCOTROL XL) 10 MG 24 hr tablet Take 2 tablets (20 mg total) by mouth daily.  60 tablet  11  . glucose blood test strip 1 each by Other route as needed. Use as instructed       . hydrochlorothiazide (HYDRODIURIL) 25 MG tablet Take 1 tablet (25 mg total) by mouth daily.  30 tablet  6  . LANCETS ULTRA THIN 30G MISC 1 each by Does not apply route 2 (two) times daily.        Marland Kitchen lisinopril (PRINIVIL,ZESTRIL) 40 MG tablet Take 2 tablets (80 mg total) by mouth daily.  60 tablet  6  . metFORMIN (GLUCOPHAGE-XR) 500 MG 24 hr tablet Take 4 tablets (2,000 mg total) by mouth daily with breakfast.  120 tablet  11  . polyethylene glycol (MIRALAX / GLYCOLAX) packet Take 17 g by mouth daily.        . simvastatin (ZOCOR) 20 MG tablet Take 1 tablet (20 mg total) by mouth every evening.  30 tablet  11  . zolpidem (AMBIEN) 5 MG tablet Take 1 tablet (5 mg  total) by mouth at bedtime as needed for sleep.  30 tablet  0   Family History  Problem Relation Age of Onset  . Prostate cancer Neg Hx   . Diabetes Father   . Diabetes Maternal Grandmother    History   Social History  . Marital Status: Single    Spouse Name: N/A    Number of Children: N/A  . Years of Education: N/A   Social History Main Topics  . Smoking status: Former Games developer  . Smokeless tobacco: None  . Alcohol Use: 9.0 oz/week    15 Cans of beer per week     Drinks a pt of [employer] q month  . Drug Use: No     uses marijuana occasionally.  Past cocaine use but currently clean  . Sexually Active: None   Other Topics Concern  . None   Social History Narrative   Single, 1 daughter and 4 grandsons in good health.  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   Review of Systems: A full 12 system ROS is negative except as noted in  the HPI and A&P.   Objective:  Physical Exam: Filed Vitals:   11/21/11 0931  BP: 149/99  Pulse: 95  Temp: 98 F (36.7 C)  TempSrc: Oral  Height: 6' 1.4" (1.864 m)  Weight: 271 lb 14.4 oz (123.333 kg)  SpO2: 97%   Constitutional: Vital signs reviewed.  Patient is a well-developed and well-nourished man in no acute distress and cooperative with exam. Alert and oriented x3.  Head: Normocephalic and atraumatic Ear: TM normal bilaterally Mouth: no erythema or exudates, MMM Eyes: PERRL, EOMI, conjunctivae normal, No scleral icterus.  Neck: Supple, Trachea midline normal ROM, No JVD, mass, thyromegaly, or carotid bruit present.  Cardiovascular: RRR, S1 normal, S2 normal, no MRG, pulses symmetric and intact bilaterally Pulmonary/Chest: CTAB, no wheezes, rales, or rhonchi Abdominal: Soft. Non-tender, non-distended, bowel sounds are normal, no masses, organomegaly, or guarding present.  GU: no CVA tenderness Musculoskeletal: No joint deformities, erythema, or stiffness, ROM full and no nontender Hematology: no cervical, inginal, or  axillary adenopathy.  Neurological: A&O x3, Strength is normal and symmetric bilaterally, cranial nerve II-XII are grossly intact, no focal motor deficit, sensory intact to light touch bilaterally.  Skin: Warm, dry and intact. No rash, cyanosis, or clubbing.  Psychiatric: Normal mood and affect. speech and behavior is normal. Judgment and thought content normal. Cognition and memory are normal.   Assessment & Plan:

## 2011-11-21 NOTE — Patient Instructions (Signed)
1.  Continue your medications as prescribed  - Make sure you are taking the clonidine twice daily every day!  2.  For the next month I want you to check your blood sugars twice daily  - Remember the Befores: Before breakfast, before lunch, before supper, and before bedtime.  - On even number days check Before breakfast and Before supper  - On odd number days check Before Lunch and Before bedtime  - Bring your meter with you to your follow up appointment so we can look it over  3.  Use the information below to follow your portion sizes.  Watching what you eat as well as taking your medications every day are what is going to get your diabetes under control.  4.  Follow up in 1 month to recheck your blood pressure as well as your blood sugar meter.   Diabetes Meal Planning Guide The diabetes meal planning guide is a tool to help you plan your meals and snacks. It is important for people with diabetes to manage their blood glucose (sugar) levels. Choosing the right foods and the right amounts throughout your day will help control your blood glucose. Eating right can even help you improve your blood pressure and reach or maintain a healthy weight. CARBOHYDRATE COUNTING MADE EASY When you eat carbohydrates, they turn to sugar. This raises your blood glucose level. Counting carbohydrates can help you control this level so you feel better. When you plan your meals by counting carbohydrates, you can have more flexibility in what you eat and balance your medicine with your food intake. Carbohydrate counting simply means adding up the total amount of carbohydrate grams in your meals and snacks. Try to eat about the same amount at each meal. Foods with carbohydrates are listed below. Each portion below is 1 carbohydrate serving or 15 grams of carbohydrates. Ask your dietician how many grams of carbohydrates you should eat at each meal or snack. Grains and Starches  1 slice bread.    English muffin or  hotdog/hamburger bun.    cup cold cereal (unsweetened).   ? cup cooked pasta or rice.    cup starchy vegetables (corn, potatoes, peas, beans, winter squash).   1 tortilla (6 inches).    bagel.   1 waffle or pancake (size of a CD).    cup cooked cereal.   4 to 6 small crackers.  *Whole grain is recommended. Fruit  1 cup fresh unsweetened berries, melon, papaya, pineapple.   1 small fresh fruit.    banana or mango.    cup fruit juice (4 oz unsweetened).    cup canned fruit in natural juice or water.   2 tbs dried fruit.   12 to 15 grapes or cherries.  Milk and Yogurt  1 cup fat-free or 1% milk.   1 cup soy milk.   6 oz light yogurt with sugar-free sweetener.   6 oz low-fat soy yogurt.   6 oz plain yogurt.  Vegetables  1 cup raw or  cup cooked is counted as 0 carbohydrates or a "free" food.   If you eat 3 or more servings at 1 meal, count them as 1 carbohydrate serving.  Other Carbohydrates   oz chips or pretzels.    cup ice cream or frozen yogurt.    cup sherbet or sorbet.   2 inch square cake, no frosting.   1 tbs honey, sugar, jam, jelly, or syrup.   2 small cookies.   3 squares of  graham crackers.   3 cups popcorn.   6 crackers.   1 cup broth-based soup.   Count 1 cup casserole or other mixed foods as 2 carbohydrate servings.   Foods with less than 20 calories in a serving may be counted as 0 carbohydrates or a "free" food.  You may want to purchase a book or computer software that lists the carbohydrate gram counts of different foods. In addition, the nutrition facts panel on the labels of the foods you eat are a good source of this information. The label will tell you how big the serving size is and the total number of carbohydrate grams you will be eating per serving. Divide this number by 15 to obtain the number of carbohydrate servings in a portion. Remember, 1 carbohydrate serving equals 15 grams of carbohydrate. SERVING  SIZES Measuring foods and serving sizes helps you make sure you are getting the right amount of food. The list below tells how big or small some common serving sizes are.  1 oz.........4 stacked dice.   3 oz........Marland KitchenDeck of cards.   1 tsp.......Marland KitchenTip of little finger.   1 tbs......Marland KitchenMarland KitchenThumb.   2 tbs.......Marland KitchenGolf ball.    cup......Marland KitchenHalf of a fist.   1 cup.......Marland KitchenA fist.  SAMPLE DIABETES MEAL PLAN Below is a sample meal plan that includes foods from the grain and starches, dairy, vegetable, fruit, and meat groups. A dietician can individualize a meal plan to fit your calorie needs and tell you the number of servings needed from each food group. However, controlling the total amount of carbohydrates in your meal or snack is more important than making sure you include all of the food groups at every meal. You may interchange carbohydrate containing foods (dairy, starches, and fruits). The meal plan below is an example of a 2000 calorie diet using carbohydrate counting. This meal plan has 17 carbohydrate servings. Breakfast  1 cup oatmeal (2 carb servings).    cup light yogurt (1 carb serving).   1 cup blueberries (1 carb serving).    cup almonds.  Snack  1 large apple (2 carb servings).   1 low-fat string cheese stick.  Lunch  Chicken breast salad.   1 cup spinach.    cup chopped tomatoes.   2 oz chicken breast, sliced.   2 tbs low-fat [place] dressing.   12 whole-wheat crackers (2 carb servings).   12 to 15 grapes (1 carb serving).   1 cup low-fat milk (1 carb serving).  Snack  1 cup carrots.    cup hummus (1 carb serving).  Dinner  3 oz broiled salmon.   1 cup brown rice (3 carb servings).  Snack  1  cups steamed broccoli (1 carb serving) drizzled with 1 tsp olive oil and lemon juice.   1 cup light pudding (2 carb servings).  DIABETES MEAL PLANNING WORKSHEET Your dietician can use this worksheet to help you decide how many servings of foods and what  types of foods are right for you.  BREAKFAST Food Group and Servings / Carb Servings Grain/Starches __________________________________ Dairy __________________________________________ Vegetable ______________________________________ Fruit ___________________________________________ Meat __________________________________________ Fat ____________________________________________ LUNCH Food Group and Servings / Carb Servings Grain/Starches ___________________________________ Dairy ___________________________________________ Fruit ____________________________________________ Meat ___________________________________________ Fat _____________________________________________ Laural Golden Food Group and Servings / Carb Servings Grain/Starches ___________________________________ Dairy ___________________________________________ Fruit ____________________________________________ Meat ___________________________________________ Fat _____________________________________________ SNACKS Food Group and Servings / Carb Servings Grain/Starches ___________________________________ Dairy ___________________________________________ Vegetable _______________________________________ Fruit ____________________________________________ Meat ___________________________________________ Fat _____________________________________________ DAILY TOTALS Starches _________________________ Vegetable ________________________ Fruit ____________________________ Dairy  ____________________________ Meat ____________________________ Fat ______________________________ Document Released: 11/09/2004 Document Revised: 02/01/2011 Document Reviewed: 09/20/2008 [facility] Patient Information 2012 [place], [place].

## 2012-04-01 ENCOUNTER — Other Ambulatory Visit: Payer: Self-pay | Admitting: *Deleted

## 2012-04-01 DIAGNOSIS — I10 Essential (primary) hypertension: Secondary | ICD-10-CM

## 2012-04-01 DIAGNOSIS — E119 Type 2 diabetes mellitus without complications: Secondary | ICD-10-CM

## 2012-04-01 MED ORDER — LISINOPRIL 40 MG PO TABS: 80.0000 mg | ORAL_TABLET | Freq: Every day | ORAL | Status: DC

## 2012-04-01 MED ORDER — HYDROCHLOROTHIAZIDE 25 MG PO TABS: 25.0000 mg | ORAL_TABLET | Freq: Every day | ORAL | Status: DC

## 2012-04-01 MED ORDER — METFORMIN HCL ER 500 MG PO TB24: 2000.0000 mg | ORAL_TABLET | Freq: Every day | ORAL | Status: DC

## 2012-04-01 NOTE — Telephone Encounter (Signed)
rx faxed in 

## 2012-04-14 ENCOUNTER — Other Ambulatory Visit: Payer: Self-pay | Admitting: *Deleted

## 2012-04-14 DIAGNOSIS — I10 Essential (primary) hypertension: Secondary | ICD-10-CM

## 2012-04-15 MED ORDER — CLONIDINE HCL 0.1 MG PO TABS: 0.1000 mg | ORAL_TABLET | Freq: Two times a day (BID) | ORAL | Status: DC

## 2012-04-15 NOTE — Telephone Encounter (Signed)
Please make Glenn Washington a follow up appointment.  He is overdue for his A1C check.

## 2012-04-15 NOTE — Telephone Encounter (Signed)
Rx faxed in, Front desk to make appointment

## 2012-05-26 ENCOUNTER — Encounter: Payer: Self-pay | Admitting: Internal Medicine

## 2012-05-26 ENCOUNTER — Ambulatory Visit (INDEPENDENT_AMBULATORY_CARE_PROVIDER_SITE_OTHER): Payer: No Typology Code available for payment source | Admitting: Internal Medicine

## 2012-05-26 ENCOUNTER — Encounter: Payer: Self-pay | Admitting: Licensed Clinical Social Worker

## 2012-05-26 VITALS — BP 126/84 | HR 96 | Temp 97.6°F | Ht 71.5 in | Wt 269.0 lb

## 2012-05-26 DIAGNOSIS — E119 Type 2 diabetes mellitus without complications: Secondary | ICD-10-CM

## 2012-05-26 DIAGNOSIS — M76899 Other specified enthesopathies of unspecified lower limb, excluding foot: Secondary | ICD-10-CM

## 2012-05-26 DIAGNOSIS — M25562 Pain in left knee: Secondary | ICD-10-CM | POA: Insufficient documentation

## 2012-05-26 DIAGNOSIS — F329 Major depressive disorder, single episode, unspecified: Secondary | ICD-10-CM

## 2012-05-26 DIAGNOSIS — M7050 Other bursitis of knee, unspecified knee: Secondary | ICD-10-CM

## 2012-05-26 MED ORDER — ZOLPIDEM TARTRATE 5 MG PO TABS: 5.0000 mg | ORAL_TABLET | Freq: Every evening | ORAL | Status: DC | PRN

## 2012-05-26 MED ORDER — FLUOXETINE HCL 20 MG PO TABS: 20.0000 mg | ORAL_TABLET | Freq: Every day | ORAL | Status: DC

## 2012-05-26 MED ORDER — MELOXICAM 15 MG PO TABS: 15.0000 mg | ORAL_TABLET | Freq: Every day | ORAL | Status: DC

## 2012-05-26 NOTE — Progress Notes (Signed)
Subjective:   Patient ID: Glenn Washington male   DOB: Nov 19, 1955 57 y.o.   MRN: [id number]  HPI: [name] is a 57 y.o. man who presents to the clinic today for follow up on his chronic medical condition including diabetes.  See Problem focused Assessment and Plan for full details of his chronic medical conditions.   He also states that he has been having problems with drinking again.  He states that he is drinking every day and hasn't taken his medications for several weeks.   He states that his brother is moving out and since he was staying with his brother he is worried that he is going to end up homeless.  He denies current SI, HI but feel worthless and hopeless today.  He is not sleeping well.  He states that he has had some thoughts of hurting himself but the are fleeting thoughts.  He states that when he thinks aout it he thinks about shooting himself.  HE does not have a gun in the house but says he knows a friend with a gun.  THe last day he thought of this was Saturday, 2 days ago.  He states that when he has these thoughts he thinks about his grandchildren and they go away.  HE states he is not sleeping at night.  He dozes off about 9 pm then up at 12 until about 4 am.  HE then is able to fall asleep until about 8:30 am.    He states that he has been having problems with pain in the front of his legs.  He states that the pain is a ache that usually happens after walking and is right under his kneecaps and "feels deep."  HE denies redness, swelling, or warmth in the knees.      Past Medical History  Diagnosis Date  . Diabetes mellitus   . Hypertension   . Hyperlipidemia   . Morbid obesity   . Constipation   . History of chest pain    Current Outpatient Prescriptions  Medication Sig Dispense Refill  . aspirin 81 MG tablet Take 81 mg by mouth daily.        . Blood Glucose Monitoring Suppl (TRUE TRACK BLOOD GLUCOSE) DEVI 1 each by Does not apply route 2 (two) times daily.        .  cloNIDine (CATAPRES) 0.1 MG tablet Take 1 tablet (0.1 mg total) by mouth 2 (two) times daily.  60 tablet  6  . FLUoxetine (PROZAC) 20 MG tablet Take 1 tablet (20 mg total) by mouth daily.  30 tablet  2  . glipiZIDE (GLUCOTROL XL) 10 MG 24 hr tablet Take 2 tablets (20 mg total) by mouth daily.  60 tablet  11  . glucose blood test strip 1 each by Other route as needed. Use as instructed       . hydrochlorothiazide (HYDRODIURIL) 25 MG tablet Take 1 tablet (25 mg total) by mouth daily.  30 tablet  6  . LANCETS ULTRA THIN 30G MISC 1 each by Does not apply route 2 (two) times daily.        Marland Kitchen lisinopril (PRINIVIL,ZESTRIL) 40 MG tablet Take 2 tablets (80 mg total) by mouth daily.  60 tablet  6  . metFORMIN (GLUCOPHAGE-XR) 500 MG 24 hr tablet Take 4 tablets (2,000 mg total) by mouth daily with breakfast.  120 tablet  11  . polyethylene glycol (MIRALAX / GLYCOLAX) packet Take 17 g by mouth daily.        Marland Kitchen  simvastatin (ZOCOR) 20 MG tablet Take 1 tablet (20 mg total) by mouth every evening.  30 tablet  11  . zolpidem (AMBIEN) 5 MG tablet Take 1 tablet (5 mg total) by mouth at bedtime as needed for sleep.  30 tablet  0   No current facility-administered medications for this visit.   Family History  Problem Relation Age of Onset  . Prostate cancer Neg Hx   . Diabetes Father   . Diabetes Maternal Grandmother    History   Social History  . Marital Status: Single    Spouse Name: N/A    Number of Children: N/A  . Years of Education: N/A   Social History Main Topics  . Smoking status: Former Games developer  . Smokeless tobacco: None  . Alcohol Use: 9.0 oz/week    15 Cans of beer per week     Comment: Drinks a pt of [employer] q month  . Drug Use: No     Comment: uses marijuana occasionally.  Past cocaine use but currently clean  . Sexually Active: None   Other Topics Concern  . None   Social History Narrative   Single, 1 daughter and 4 grandsons in good health.  Does work in Holiday representative and odd  household jobs.   Review of Systems: A full 12 system ROS is negative except as noted in the HPI and A&P.   Objective:  Physical Exam: Filed Vitals:   05/26/12 0929  BP: 126/84  Pulse: 96  Temp: 97.6 F (36.4 C)  TempSrc: Oral  Height: 5' 11.5" (1.816 m)  Weight: 269 lb (122.018 kg)  SpO2: 97%   Constitutional: Vital signs reviewed.  Patient is a well-developed and well-nourished man in no acute distress and cooperative with exam. Alert and oriented x3.  Head: Normocephalic and atraumatic Ear: TM normal bilaterally Mouth: no erythema or exudates, MMM Eyes: PERRL, EOMI, conjunctivae normal, No scleral icterus.  Neck: Supple, Trachea midline normal ROM, No JVD, mass, thyromegaly, or carotid bruit present.  Cardiovascular: RRR, S1 normal, S2 normal, no MRG, pulses symmetric and intact bilaterally Pulmonary/Chest: CTAB, no wheezes, rales, or rhonchi Abdominal: Soft. Non-tender, non-distended, bowel sounds are normal, no masses, organomegaly, or guarding present.  GU: no CVA tenderness Musculoskeletal: No joint deformities, erythema, or stiffness, There is tenderness in the prepatellar bursa bilaterally as well as lateral and medial compartments of the knees.  Patellar grind test is positive.  Hematology: no cervical, inginal, or axillary adenopathy.  Neurological: A&O x3, Strength is normal and symmetric bilaterally, cranial nerve II-XII are grossly intact, no focal motor deficit, sensory intact to light touch bilaterally.  Skin: Warm, dry and intact. No rash, cyanosis, or clubbing.  Psychiatric: depressed mood and flat and labile affect. speech and behavior is normal. Judgment, insight, and thought content normal. Cognition and memory are normal.   Assessment & Plan:

## 2012-05-26 NOTE — Progress Notes (Signed)
[name] was referred to CSW as pt voiced suicidal ideation and voiced concern over possible homelessness.  [name] is currently unemployed, but has the [employer] card.  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  CSW encouraged pt for keeping appointment today and finding money for transportation to maintain appt.  Pt voiced feelings of hopelessness regarding his housing situation.  Every options CSW posed was met with negative comments.  CSW provided emotional support to start process of goal setting.  Pt currently lives with his brother but feels this situation will come to an end soon, but brother has not voiced this.  Pt does not want to stay at a shelter but does have listing.  CSW provided [name] with information to [employer].  Pt hesitant to utilize [facility] because he use to date a staff member there, pt unsure if staff member still works there.  CSW encouraged pt to at least call to explore housing resources and if staff member still is employed there.  CSW also provided [name] with information on transitional housing: [employer] and [employer] as resources.  Pt in agreement to referral to [facility], uninsured program to assist with community case management and possible information on transitional housing in [place].  CSW faxed Referral.   [name] also voiced suicidal ideation.  Pt states over the weekend he thought about using a gun to end his life.  Pt does have access to a gun, but state he will not use gun and will call the suicide hotline if he has thoughts like that again.  [name] has sought mental health treatment in the past at [facility], it was over a year/two ago.  Pt states he saw a therapist and psychiatrist but did not return or fill prescription because of concern for serious side effects.  CSW encouraged pt to at least continue to follow with therapy options and discuss side effect concerns with  physician/psychiatrist.  CSW provided [name] information on [facility] of the [place], walk-in hours both [place] and [employer].  Pt received prescription for anti-depressant today, pt agreeable to fill and try.  RN assistance along with [facility], pt was able to fill a three new prescriptions received today for $12.  CSW provided [name] with $15 of donated funds to purchase medications today.  Offered pantry assistance, pt declined and states he receives food stamps.  Provided [name] with one fare bus pass to return home and an all-day bus pass for explore housing options.  CSW numbered resources for [name] to follow up on: [address], The [address], [employer] of [place], calling [facility] to explore housing optons.  Pt also provided with vocational information [employer], [employer], [employer]) and [employer] for socialization as they offer free programs.  All information put in folder for [name].  Pt aware of referral to [facility] and [facility].  Pt has CSW contact information and aware of CSW hours.  Pt denies additional immediate need at this time.  Pt aware he is to schedule follow-up [facility] appt and call [facility] for transportation to/from appt.

## 2012-05-26 NOTE — Patient Instructions (Signed)
1.  Start Mobic 15 mg tablets.  Take 1 tablet daily for your knee pain.  2.  Start Prozac 20 mg capsules.  Take 1 capsule daily for your mood  3.  Use the Ambien at bedtime to help you rest.  4.  If you have thoughts of hurting yourself that don't go away you need to call for help. You can call 911 or you can call the suicide prevention hotline at ([phone]  5.  Follow up with me in 1 week to see how you are doing.

## 2012-06-06 ENCOUNTER — Ambulatory Visit (INDEPENDENT_AMBULATORY_CARE_PROVIDER_SITE_OTHER): Payer: No Typology Code available for payment source | Admitting: Internal Medicine

## 2012-06-06 ENCOUNTER — Encounter: Payer: Self-pay | Admitting: Internal Medicine

## 2012-06-06 VITALS — BP 155/103 | HR 98 | Temp 97.4°F | Wt 270.0 lb

## 2012-06-06 DIAGNOSIS — F329 Major depressive disorder, single episode, unspecified: Secondary | ICD-10-CM

## 2012-06-06 DIAGNOSIS — M7050 Other bursitis of knee, unspecified knee: Secondary | ICD-10-CM

## 2012-06-06 DIAGNOSIS — M76899 Other specified enthesopathies of unspecified lower limb, excluding foot: Secondary | ICD-10-CM

## 2012-06-06 DIAGNOSIS — E119 Type 2 diabetes mellitus without complications: Secondary | ICD-10-CM

## 2012-06-06 DIAGNOSIS — I10 Essential (primary) hypertension: Secondary | ICD-10-CM

## 2012-06-06 NOTE — Progress Notes (Signed)
Subjective:   Patient ID: [name] male   DOB: [birth year] 57 y.o.   MRN: [id number]  HPI: [name] is a 57 y.o. man who presents to clinic today for follow up from his last appointment.  He has  PMH significant for diabetes and hypertension.  See Problem focused Assessment and Plan for full details of his chronic medical conditions.   He states that after his last appointment he went and got his medications and has been taking the prozac and ambien.  He states that he is feeling better.  The Ambien is helping him rest a lot and states that he feels the prozac is helping but not completely.  He has not been drinking and has not had any further suicidal ideations.    He states that the pain in his knees is better since starting the mobic, ice, and resting it.    Past Medical History  Diagnosis Date  . Diabetes mellitus   . Hypertension   . Hyperlipidemia   . Morbid obesity   . Constipation   . History of chest pain    Current Outpatient Prescriptions  Medication Sig Dispense Refill  . aspirin 81 MG tablet Take 81 mg by mouth daily.        . Blood Glucose Monitoring Suppl (TRUE TRACK BLOOD GLUCOSE) DEVI 1 each by Does not apply route 2 (two) times daily.        . cloNIDine (CATAPRES) 0.1 MG tablet Take 1 tablet (0.1 mg total) by mouth 2 (two) times daily.  60 tablet  6  . FLUoxetine (PROZAC) 20 MG tablet Take 1 tablet (20 mg total) by mouth daily.  30 tablet  2  . glipiZIDE (GLUCOTROL XL) 10 MG 24 hr tablet Take 2 tablets (20 mg total) by mouth daily.  60 tablet  11  . glucose blood test strip 1 each by Other route as needed. Use as instructed       . hydrochlorothiazide (HYDRODIURIL) 25 MG tablet Take 1 tablet (25 mg total) by mouth daily.  30 tablet  6  . LANCETS ULTRA THIN 30G MISC 1 each by Does not apply route 2 (two) times daily.        Marland Kitchen lisinopril (PRINIVIL,ZESTRIL) 40 MG tablet Take 2 tablets (80 mg total) by mouth daily.  60 tablet  6  . meloxicam (MOBIC) 15 MG tablet  Take 1 tablet (15 mg total) by mouth daily.  30 tablet  3  . metFORMIN (GLUCOPHAGE-XR) 500 MG 24 hr tablet Take 4 tablets (2,000 mg total) by mouth daily with breakfast.  120 tablet  11  . polyethylene glycol (MIRALAX / GLYCOLAX) packet Take 17 g by mouth daily.        . simvastatin (ZOCOR) 20 MG tablet Take 1 tablet (20 mg total) by mouth every evening.  30 tablet  11  . zolpidem (AMBIEN) 5 MG tablet Take 1 tablet (5 mg total) by mouth at bedtime as needed for sleep.  30 tablet  2   No current facility-administered medications for this visit.   Family History  Problem Relation Age of Onset  . Prostate cancer Neg Hx   . Diabetes Father   . Diabetes Maternal Grandmother    History   Social History  . Marital Status: Single    Spouse Name: N/A    Number of Children: N/A  . Years of Education: N/A   Social History Main Topics  . Smoking status: Former Games developer  . Smokeless tobacco:  None  . Alcohol Use: 9.0 oz/week    15 Cans of beer per week     Comment: Drinks a pt of [employer] q month  . Drug Use: No     Comment: uses marijuana occasionally.  Past cocaine use but currently clean  . Sexually Active: None   Other Topics Concern  . None   Social History Narrative   Single, 1 daughter and 4 grandsons in good health.  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   Review of Systems: See Problem focused Assessment and Plan for full details of his chronic medical conditions.   Objective:  Physical Exam: Filed Vitals:   06/06/12 1328  BP: 164/96  Pulse: 103  Temp: 97.4 F (36.3 C)  TempSrc: Oral  Weight: 270 lb (122.471 kg)  SpO2: 96%   Constitutional: Vital signs reviewed.  Patient is a well-developed and well-nourished man in no acute distress and cooperative with exam. Alert and oriented x3.  Head: Normocephalic and atraumatic Ear: TM normal bilaterally Mouth: no erythema or exudates, MMM Eyes: PERRL, EOMI, conjunctivae normal, No scleral icterus.  Neck:  Supple, Trachea midline normal ROM, No JVD, mass, thyromegaly, or carotid bruit present.  Cardiovascular: RRR, S1 normal, S2 normal, no MRG, pulses symmetric and intact bilaterally Pulmonary/Chest: CTAB, no wheezes, rales, or rhonchi Abdominal: Soft. Non-tender, non-distended, bowel sounds are normal, no masses, organomegaly, or guarding present.  GU: no CVA tenderness Musculoskeletal: No joint deformities, erythema, or stiffness, still mild pain to palpation in the suprapatellar bursa.   Hematology: no cervical, inginal, or axillary adenopathy.  Neurological: A&O x3, Strength is normal and symmetric bilaterally, cranial nerve II-XII are grossly intact, no focal motor deficit, sensory intact to light touch bilaterally.  Skin: Warm, dry and intact. No rash, cyanosis, or clubbing.  Psychiatric: normal mood and flat but more hopeful affect. speech and behavior is normal. Judgment and thought content normal. Cognition and memory are normal.   Assessment & Plan:

## 2012-06-06 NOTE — Patient Instructions (Signed)
1. I'm glad to see that you are feeling better.  Continue the medications for now as prescribed.  2.  Remember to take your blood pressure medications every day.    3.  I encourage you to check out [facility] of the [place].  Their address is: [address], [place], [place] [address] and their phone number is [phone].  4.  Follow up on 07/18/12 to see how you are doing.  If you need anything between now and then please don't hesitate to call us [phone]

## 2012-07-18 ENCOUNTER — Encounter: Payer: Self-pay | Admitting: Internal Medicine

## 2012-07-18 ENCOUNTER — Ambulatory Visit: Payer: Self-pay

## 2012-08-12 ENCOUNTER — Ambulatory Visit: Payer: No Typology Code available for payment source

## 2012-08-22 NOTE — Assessment & Plan Note (Signed)
Improved.  Continue the current medications for another week or so and then change mobic to PRN.

## 2012-08-22 NOTE — Assessment & Plan Note (Signed)
He has had a decompensation in his depression today and has had fleeting thoughts of suicide over the last few weeks.  He state that he would not kill himself because of his grandchildren and that it would hurt them and he couldn't allow that.  WE discussed making sure that he removes his access to guns for his safety.  We will restart his prozac today as well as the ambien for his sleep.  We also discussed counseling but he would like to start with the medications because the have worked in the past for him.

## 2012-08-22 NOTE — Assessment & Plan Note (Signed)
HE states that he has been taking his glipizide twice daily and his metformin 2 tablets twice daily.  He does try to watch his diet he says but doesn't check his blood sugar often.    Hemoglobin A1C (%)  Date Value  11/21/2011 9.3   09/18/2011 10.7   12/16/2009 7.2   08/04/2009 7.9   04/12/2009 10.1    A1C is better controlled today then previously but not much better.  We will have him increase his testing to twice daily for te next month and keep a food log so he can work with the diabetes educator to better control his diabetes.

## 2012-08-22 NOTE — Assessment & Plan Note (Signed)
H states that he follows with Dr. Hyacinth Meeker for his eyes.  He has not had any changesin his vision lately and is doing well.    HE was encouraged to continue tight control of his blood sugar and hypertension to limit any further damage to his eyes.

## 2012-08-22 NOTE — Assessment & Plan Note (Signed)
HE states that he has been taking his medications as prescribed.  When asked he states that he is taking his clonidine once daily, HCTZ once daily, 2 lisinopril daily.  HE also mentions that he misses doses "here and there."  He denies headache, chest pain, or recent changes in his vision.    BP Readings from Last 3 Encounters:  11/21/11 104/75  09/18/11 143/96  02/08/11 135/84   Blood pressure today is well controlled today and he states that he took his medication this morning.  We discussed that his clonidine is twice daily medication.  We willcontinue his current medications for now and encourage him to take his medication every day.

## 2012-08-22 NOTE — Assessment & Plan Note (Signed)
His knee pain is consistent with bilateral prepatellar bursitis.  Likely from increased activity. We will start with ice, mobic, and rest.

## 2012-08-22 NOTE — Assessment & Plan Note (Signed)
He states that he is taking his medication, simvastatin, but admits that he misses days "here and there."  He denies leg pain, or changes in his urine.    Lab Results  Component Value Date   CHOL 225* 09/18/2011   CHOL 103 03/23/2010   CHOL 138 05/16/2009   Lab Results  Component Value Date   HDL 55 09/18/2011   HDL 42 1/61/0960   HDL 53 4/54/0981   Lab Results  Component Value Date   LDLCALC 141* 09/18/2011   LDLCALC 49 03/23/2010   LDLCALC 71 05/16/2009   Lab Results  Component Value Date   TRIG 144 09/18/2011   TRIG 62 03/23/2010   TRIG 71 05/16/2009   Lab Results  Component Value Date   CHOLHDL 4.1 09/18/2011   CHOLHDL 2.5 Ratio 03/23/2010   CHOLHDL 2.6 Ratio 05/16/2009   No results found for this basename: LDLDIRECT   He is not due for his recheck today for his lipids but the last testing shows that has missed more the a couple of days.  We will refill his medication and we discussed methods for helping remember how to take his medications including setting alarms on his phone or using a pill box.

## 2012-08-22 NOTE — Assessment & Plan Note (Signed)
Mood is much improved today though still labile.  He has not had any more SI and is feeling more hopeful but still a bit overwhelmed.  WE discussed resources if he were to lose his place to stay and I offered to have our social worker call him but he declined at this time.  We will continue his prozac and ambien.

## 2012-08-22 NOTE — Assessment & Plan Note (Signed)
Has been taking his blood pressure medications since our last visit.  HE states he feels better and denies headache, chest pain, or changes in his vision.  BP Readings from Last 3 Encounters:  06/06/12 155/103  05/26/12 126/84  11/21/11 104/75   Blood pressure is a little more out of control but he hasn't taken his medication today yet.  WE wll continue to monitor now that he is taking his medication again.

## 2012-08-22 NOTE — Assessment & Plan Note (Signed)
He has picked up his medication and is taking it again.  He thinks his energy level is better as well as there is less polyuria and polydipsia.  A1C done 10 days ago was out of control compared to previous.  We will continue to monitor now that he is back on his medication.

## 2012-08-22 NOTE — Assessment & Plan Note (Signed)
HE has not been taking any of his medications with this decompensation in his mood.  HE has noticed some polyuria and polydipsia.    Hemoglobin A1C (%)  Date Value  05/26/2012 11.8   11/21/2011 9.3   09/18/2011 10.7    A1C today is not surprising with his lack of medication.  We will work on fixing his mood which should increase his compliance with his other medications.

## 2012-09-04 ENCOUNTER — Other Ambulatory Visit: Payer: Self-pay

## 2012-09-26 ENCOUNTER — Other Ambulatory Visit: Payer: Self-pay | Admitting: *Deleted

## 2012-09-26 DIAGNOSIS — E119 Type 2 diabetes mellitus without complications: Secondary | ICD-10-CM

## 2012-09-26 MED ORDER — GLIPIZIDE ER 10 MG PO TB24: 20.0000 mg | ORAL_TABLET | Freq: Every day | ORAL | Status: DC

## 2012-09-30 ENCOUNTER — Encounter: Payer: No Typology Code available for payment source | Admitting: Internal Medicine

## 2012-10-09 ENCOUNTER — Ambulatory Visit: Payer: No Typology Code available for payment source | Admitting: Internal Medicine

## 2012-10-09 ENCOUNTER — Encounter: Payer: No Typology Code available for payment source | Admitting: Dietician

## 2012-10-09 ENCOUNTER — Ambulatory Visit (INDEPENDENT_AMBULATORY_CARE_PROVIDER_SITE_OTHER): Payer: No Typology Code available for payment source | Admitting: Internal Medicine

## 2012-10-09 VITALS — BP 144/102 | HR 93 | Temp 97.6°F | Wt 272.1 lb

## 2012-10-09 DIAGNOSIS — F329 Major depressive disorder, single episode, unspecified: Secondary | ICD-10-CM

## 2012-10-09 DIAGNOSIS — E119 Type 2 diabetes mellitus without complications: Secondary | ICD-10-CM

## 2012-10-09 DIAGNOSIS — E785 Hyperlipidemia, unspecified: Secondary | ICD-10-CM

## 2012-10-09 DIAGNOSIS — M76899 Other specified enthesopathies of unspecified lower limb, excluding foot: Secondary | ICD-10-CM

## 2012-10-09 DIAGNOSIS — I10 Essential (primary) hypertension: Secondary | ICD-10-CM

## 2012-10-09 DIAGNOSIS — M7050 Other bursitis of knee, unspecified knee: Secondary | ICD-10-CM

## 2012-10-09 LAB — CBC
MCH: 28.4 pg (ref 26.0–34.0)
MCV: 82.5 fL (ref 78.0–100.0)
Platelets: 215 10*3/uL (ref 150–400)
RBC: 5.59 MIL/uL (ref 4.22–5.81)
RDW: 12.9 % (ref 11.5–15.5)

## 2012-10-09 LAB — LIPID PANEL
Cholesterol: 258 mg/dL — ABNORMAL HIGH (ref 0–200)
HDL: 53 mg/dL (ref >39)
LDL Cholesterol: 178 mg/dL — ABNORMAL HIGH (ref 0–99)
Triglycerides: 137 mg/dL (ref <150)

## 2012-10-09 LAB — COMPLETE METABOLIC PANEL WITH GFR
ALT: 19 U/L (ref 0–53)
AST: 18 U/L (ref 0–37)
Albumin: 4 g/dL (ref 3.5–5.2)
Calcium: 10.1 mg/dL (ref 8.4–10.5)
Chloride: 98 mEq/L (ref 96–112)
Potassium: 4.6 mEq/L (ref 3.5–5.3)

## 2012-10-09 MED ORDER — LISINOPRIL 40 MG PO TABS: 80.0000 mg | ORAL_TABLET | Freq: Every day | ORAL | Status: DC

## 2012-10-09 MED ORDER — CLONIDINE HCL 0.1 MG PO TABS: 0.1000 mg | ORAL_TABLET | Freq: Two times a day (BID) | ORAL | Status: DC

## 2012-10-09 MED ORDER — TRAMADOL HCL 50 MG PO TABS: 50.0000 mg | ORAL_TABLET | Freq: Three times a day (TID) | ORAL | Status: DC | PRN

## 2012-10-09 MED ORDER — INSULIN GLARGINE 100 UNIT/ML SOLOSTAR PEN: 8.0000 [IU] | PEN_INJECTOR | Freq: Every day | SUBCUTANEOUS | Status: DC

## 2012-10-09 MED ORDER — INSULIN PEN NEEDLE 31G X 5 MM MISC: Status: DC

## 2012-10-09 MED ORDER — GLIPIZIDE ER 10 MG PO TB24: 20.0000 mg | ORAL_TABLET | Freq: Every day | ORAL | Status: DC

## 2012-10-09 MED ORDER — HYDROCHLOROTHIAZIDE 25 MG PO TABS: 25.0000 mg | ORAL_TABLET | Freq: Every day | ORAL | Status: DC

## 2012-10-09 NOTE — Patient Instructions (Addendum)
Follow-up with Dr. Aundria Rud in 2 weeks.   Please go to [facility] at the [facility] after your visit today. They need to look at your paperwork in order to place the order for your insulin pens (I sent the prescription to them).   Good luck getting started with your insulin!  Remember to read your insulin instructions!  Don't forget to dial in 2 and TEST the needle by shooting it up in the air before you give yourself an injection! Then dial in 8 units for now when you are giving yourself an actual injection.   Also please remember to check your blood sugar using your meter and strips every morning when you wake up.  Please remember to bring all of your medicines and your meter to your next appointment.   We are also prescribing you pain medicine for your knees.  Once your knees are feeling better, you should be able to walk easier and then you can walk to the [facility] to measure your blood pressure.  Be sure to write your blood pressures down on the log we gave you today. That will also give you some exercise as you try to work on losing weight.   There is some information below about diabetic diet planning.  Keep working on avoiding those sweets!  Insulin Glargine injection What is this medicine? INSULIN GLARGINE (IN su lin GLAR geen) is a human-made form of insulin. This drug lowers the amount of sugar in your blood. It is a long-acting insulin that is usually given once a day. This medicine may be used for other purposes; ask your health care provider or pharmacist if you have questions. What should I tell my health care provider before I take this medicine? They need to know if you have any of these conditions: -episodes of hypoglycemia -kidney disease -liver disease -an unusual or allergic reaction to insulin, metacresol, other medicines, foods, dyes, or preservatives -pregnant or trying to get pregnant -breast-feeding How should I use this  medicine? This medicine is for injection under the skin. Use exactly as directed. It is important to follow the directions given to you by your health care professional or doctor. You will be taught how to use this medicine and how to adjust doses for activities and illness. You may take this medicine at any time of the day but you must take it at the same time everyday. Do not use more insulin than prescribed. Do not use more or less often than prescribed. Always check the appearance of your insulin before using it. This medicine should be clear and colorless like water. Do not use it if it is cloudy, thickened, colored, or has solid particles in it. Do not mix this medicine with any other insulin or diluent. It is important that you put your used needles and syringes in a special sharps container. Do not put them in a trash can. If you do not have a sharps container, call your pharmacist or healthcare provider to get one. Talk to your pediatrician regarding the use of this medicine in children. Special care may be needed. Overdosage: If you think you have taken too much of this medicine contact a poison control center or emergency room at once. NOTE: This medicine is only for you. Do not share this medicine with others. What if I miss a dose? It is important not to miss a dose. Your health care professional or doctor should discuss a plan for missed doses with  you. If you do miss a dose, follow their plan. Do not take double doses. What may interact with this medicine? -other medicines for diabetes Many medications may cause an increase or decrease in blood sugar, these include: -alcohol containing beverages -aspirin and aspirin-like drugs -chloramphenicol -chromium -diuretics -male hormones, like estrogens or progestins and birth control pills -heart medicines -isoniazid -male hormones or anabolic steroids -medicines for weight loss -medicines for allergies, asthma, cold, or  cough -medicines for mental problems -medicines called MAO Inhibitors like Nardil, Parnate, Marplan, Eldepryl -niacin -NSAIDs, medicines for pain and inflammation, like ibuprofen or naproxen -pentamidine -phenytoin -probenecid -quinolone antibiotics like ciprofloxacin, levofloxacin, ofloxacin -some herbal dietary supplements -steroid medicines like prednisone or cortisone -thyroid medicine Some medications can hide the warning symptoms of low blood sugar. You may need to monitor your blood sugar more closely if you are taking one of these medications. These include: -beta-blockers such as atenolol, metoprolol, propranolol -clonidine -guanethidine -reserpine This list may not describe all possible interactions. Give your health care provider a list of all the medicines, herbs, non-prescription drugs, or dietary supplements you use. Also tell them if you smoke, drink alcohol, or use illegal drugs. Some items may interact with your medicine. What should I watch for while using this medicine? Visit your health care professional or doctor for regular checks on your progress. To control your diabetes you must use this medicine regularly and follow a diet and exercise schedule. Checking and recording your blood sugar and urine ketone levels regularly is important. Use a blood sugar measuring device before you treat high or low blood sugar. Always carry a quick-source of sugar with you in case you have symptoms of low blood sugar. Examples include hard sugar candy or glucose tablets. Make sure family members know that you can choke if you eat or drink when you develop serious symptoms of low blood sugar, such as seizures or unconsciousness. They must get medical help at once. Make sure that you have the right kind of syringe for the type of insulin you use. Try not to change the brand and type of insulin or syringe unless your health care professional or doctor tells you to. Switching insulin brand or  type can cause dangerously high or low blood sugar. Always keep an extra supply of insulin, syringes, and needles on hand. Use a syringe one time only. Throw away syringe and needle in a closed container to prevent accidental needle sticks. Insulin pens and cartridges should never be shared. Sharing may result in passing of viruses like hepatitis or HIV. Wear a medical identification bracelet or chain to say you have diabetes, and carry a card that lists all your medications. Many nonprescription cough and cold products contain sugar or alcohol. These can affect diabetes control or can alter the results of tests used to monitor blood sugar. Avoid alcohol. Avoid products that contain alcohol or sugar. What side effects may I notice from receiving this medicine? Side effects that you should report to your health care professional or doctor as soon as possible: Symptoms of low blood sugar: -You may feel nervous, confused, dizzy, hungry, weak, sweaty, shaky, cold, and irritable. You may also experience headache, blurred vision, rapid heartbeat and loss of consciousness. Symptoms of high blood sugar: -You may experience dizziness, dry mouth, dry skin, fruity breath, loss of appetite, nausea, stomach ache, unusual thirst, frequent urination Insulin also can cause rare but serious allergic reactions in some patients, including: -bad skin rash and itching -breathing problems Side  effects that usually do not require medical attention (report to your health care professional or doctor if they continue or are bothersome): -increase or decrease in fatty tissue under the skin, through overuse of a particular injection -itching, burning, swelling, or rash at the injection site This list may not describe all possible side effects. Call your doctor for medical advice about side effects. You may report side effects to FDA at 1-800-FDA-1088. Where should I keep my medicine? Keep out of the reach of children. Store  unopened vials in a refrigerator between 2 and 8 degrees C (36 and 46 degrees F). Do not freeze or use if the insulin has been frozen. Opened vials (vials currently in use) may be stored in the refrigerator or at room temperature, at approximately 25 degrees C (77 degrees F) or cooler. Keeping your insulin at room temperature decreases the amount of pain during injection. Once opened, your insulin can be used for 28 days. After 28 days, the vial should be thrown away. Store unopened pen-injector cartridges in a refrigerator between 2 and 8 degrees C (36 and 46 degrees F.) Do not freeze or use if the insulin has been frozen. Insulin cartridges inserted into the [employer] system should be kept at room temperature, approximately 25 degrees C (77 degrees F) or cooler. Do not store in the refrigerator. Once inserted into the [employer] system, the insulin can be used for 28 days. After 28 days, the cartridge should be thrown away. Protect from light and excessive heat. Throw away any unused medicine after the expiration date or after the specified time for room temperature storage has passed. NOTE: This sheet is a summary. It may not cover all possible information. If you have questions about this medicine, talk to your doctor, pharmacist, or health care provider.  2013, [employer]. (05/16/2007 11:47:33 AM)  Diabetes Meal Planning Guide The diabetes meal planning guide is a tool to help you plan your meals and snacks. It is important for people with diabetes to manage their blood glucose (sugar) levels. Choosing the right foods and the right amounts throughout your day will help control your blood glucose. Eating right can even help you improve your blood pressure and reach or maintain a healthy weight. CARBOHYDRATE COUNTING MADE EASY When you eat carbohydrates, they turn to sugar. This raises your blood glucose level. Counting carbohydrates can help you control this level so you feel better. When you  plan your meals by counting carbohydrates, you can have more flexibility in what you eat and balance your medicine with your food intake. Carbohydrate counting simply means adding up the total amount of carbohydrate grams in your meals and snacks. Try to eat about the same amount at each meal. Foods with carbohydrates are listed below. Each portion below is 1 carbohydrate serving or 15 grams of carbohydrates. Ask your dietician how many grams of carbohydrates you should eat at each meal or snack. Grains and Starches  1 slice bread.   English muffin or hotdog/hamburger bun.   cup cold cereal (unsweetened).   cup cooked pasta or rice.   cup starchy vegetables (corn, potatoes, peas, beans, winter squash).  1 tortilla (6 inches).   bagel.  1 waffle or pancake (size of a CD).   cup cooked cereal.  4 to 6 small crackers. *Whole grain is recommended. Fruit  1 cup fresh unsweetened berries, melon, papaya, pineapple.  1 small fresh fruit.   banana or mango.   cup fruit juice (4 oz unsweetened).  cup canned fruit in natural juice or water.  2 tbs dried fruit.  12 to 15 grapes or cherries. Milk and Yogurt  1 cup fat-free or 1% milk.  1 cup soy milk.  6 oz light yogurt with sugar-free sweetener.  6 oz low-fat soy yogurt.  6 oz plain yogurt. Vegetables  1 cup raw or  cup cooked is counted as 0 carbohydrates or a "free" food.  If you eat 3 or more servings at 1 meal, count them as 1 carbohydrate serving. Other Carbohydrates   oz chips or pretzels.   cup ice cream or frozen yogurt.   cup sherbet or sorbet.  2 inch square cake, no frosting.  1 tbs honey, sugar, jam, jelly, or syrup.  2 small cookies.  3 squares of graham crackers.  3 cups popcorn.  6 crackers.  1 cup broth-based soup.  Count 1 cup casserole or other mixed foods as 2 carbohydrate servings.  Foods with less than 20 calories in a serving may be counted as 0 carbohydrates or a  "free" food. You may want to purchase a book or computer software that lists the carbohydrate gram counts of different foods. In addition, the nutrition facts panel on the labels of the foods you eat are a good source of this information. The label will tell you how big the serving size is and the total number of carbohydrate grams you will be eating per serving. Divide this number by 15 to obtain the number of carbohydrate servings in a portion. Remember, 1 carbohydrate serving equals 15 grams of carbohydrate. SERVING SIZES Measuring foods and serving sizes helps you make sure you are getting the right amount of food. The list below tells how big or small some common serving sizes are.  1 oz.........4 stacked dice.  3 oz........Marland KitchenDeck of cards.  1 tsp.......Marland KitchenTip of little finger.  1 tbs......Marland KitchenMarland KitchenThumb.  2 tbs.......Marland KitchenGolf ball.   cup......Marland KitchenHalf of a fist.  1 cup.......Marland KitchenA fist. SAMPLE DIABETES MEAL PLAN Below is a sample meal plan that includes foods from the grain and starches, dairy, vegetable, fruit, and meat groups. A dietician can individualize a meal plan to fit your calorie needs and tell you the number of servings needed from each food group. However, controlling the total amount of carbohydrates in your meal or snack is more important than making sure you include all of the food groups at every meal. You may interchange carbohydrate containing foods (dairy, starches, and fruits). The meal plan below is an example of a 2000 calorie diet using carbohydrate counting. This meal plan has 17 carbohydrate servings. Breakfast  1 cup oatmeal (2 carb servings).   cup light yogurt (1 carb serving).  1 cup blueberries (1 carb serving).   cup almonds. Snack  1 large apple (2 carb servings).  1 low-fat string cheese stick. Lunch  Chicken breast salad.  1 cup spinach.   cup chopped tomatoes.  2 oz chicken breast, sliced.  2 tbs low-fat [place] dressing.  12 whole-wheat  crackers (2 carb servings).  12 to 15 grapes (1 carb serving).  1 cup low-fat milk (1 carb serving). Snack  1 cup carrots.   cup hummus (1 carb serving). Dinner  3 oz broiled salmon.  1 cup brown rice (3 carb servings). Snack  1  cups steamed broccoli (1 carb serving) drizzled with 1 tsp olive oil and lemon juice.  1 cup light pudding (2 carb servings). DIABETES MEAL PLANNING WORKSHEET Your dietician can use this worksheet to help  you decide how many servings of foods and what types of foods are right for you.  BREAKFAST Food Group and Servings / Carb Servings Grain/Starches __________________________________ Dairy __________________________________________ Vegetable ______________________________________ Fruit ___________________________________________ Meat __________________________________________ Fat ____________________________________________ LUNCH Food Group and Servings / Carb Servings Grain/Starches ___________________________________ Dairy ___________________________________________ Fruit ____________________________________________ Meat ___________________________________________ Fat _____________________________________________ Laural Golden Food Group and Servings / Carb Servings Grain/Starches ___________________________________ Dairy ___________________________________________ Fruit ____________________________________________ Meat ___________________________________________ Fat _____________________________________________ SNACKS Food Group and Servings / Carb Servings Grain/Starches ___________________________________ Dairy ___________________________________________ Vegetable _______________________________________ Fruit ____________________________________________ Meat ___________________________________________ Fat _____________________________________________ DAILY TOTALS Starches _________________________ Vegetable  ________________________ Fruit ____________________________ Dairy ____________________________ Meat ____________________________ Fat ______________________________ Document Released: 11/09/2004 Document Revised: 05/07/2011 Document Reviewed: 09/20/2008 [employer] Patient Information 2014 [employer] [employer]

## 2012-10-09 NOTE — Assessment & Plan Note (Addendum)
  Lab Results  Component Value Date   HGBA1C 11.7 10/09/2012   HGBA1C 11.8 05/26/2012   HGBA1C 9.3 11/21/2011     Assessment: Pt's diabetes is uncontrolled on maximal doses of metformin and glipizide, A1C unchanged since last spring when there was question of medication compliance, but now pt states that he has been taking all of his medicines as prescribed with only an occasional missed dose.  He was initially resistant to the idea of starting insulin but then agreed to start Lantus at low dose today.  Lupita Leash Plyler spent a long time teaching pt how to use his insulin pen today and a one month supply of Lantus and necessary supplies were provided.     Diabetes control: poor control (HgbA1C >9%) Progress toward A1C goal:  unchanged  Plan: Pt will continue his current regimen of metformin and glipizide in addition to Lantus 8 units daily (will likely need to up-titrate this dose at next visit).  Pt will also start checking his blood sugar every morning (states that he has meter and strips at home).  He will bring his medicines and meter to his next visit.  He plans to take his Lantus in the morning since that is when he best remembers to take his medicines.  Also encouraged weight loss through diet and exercise.  Explained to pt that he will have to go to the [facility] program (provided phone number and information required) to register between now and our next visit in order to start getting his insulin there once he runs out of what we provided today. Follow-up in 2 weeks.  We need to check in at next visit about cost of needles needed for Lantus use ($7-10/month at [facility]), may want to switch to Levemir since needles are included. Medications:  continue current medications, in addition to Lantus 8 units daily Home glucose monitoring: Frequency: once a day Timing: before breakfast Instruction/counseling given: reminded to bring blood glucose meter & log to each visit plus medication plus need for weight  loss Educational resources provided: brochure Self management tools provided:  information on MAP program  ADDENDUM: Pt's urine microalbumin:creatinine ratio 129, increased from 30 in 2013.

## 2012-10-09 NOTE — Assessment & Plan Note (Addendum)
BP Readings from Last 3 Encounters:  10/09/12 144/102  06/06/12 155/103  05/26/12 126/84    Lab Results  Component Value Date   NA 134* 09/18/2011   K 4.3 09/18/2011   CREATININE 0.92 09/18/2011    Assessment: BP relatively well-controlled.  Given that we are starting insulin therapy for diabetes today, will hold off on making any changes to antihypertensive regimen until next visit.  Blood pressure control: mildly elevated Progress toward BP goal:  improved  Plan: Pt encouraged to bring all of his medicines and his blood pressure log that we are giving him today to his next visit.  We came up with a plan for him to walk to his local [facility] which is 5 blocks from his house every day or every other day in order to measure his blood pressure and get some exercise as well.  He understands that he must rest for at least 5 minutes after walking there before checking his blood pressure.  Medications:  continue current medications- clonidine, lisinopril, HCTZ Self management tools provided: home blood pressure logbook Other plans: continue emphasizing that blood pressure must be taken at rest, continue encouraging weight loss, BMP today

## 2012-10-09 NOTE — Progress Notes (Signed)
Patient ID: Glenn Washington, male   DOB: Sep 22, 1955, 57 y.o.   MRN: [id number]   Subjective:   Patient ID: Glenn Washington male   DOB: [birth year] 57 y.o.   MRN: [id number]  HPI: [name] is a 57 y.o. man with history of uncontrolled DM2, hypertension, bilateral knee patellar bursitis who presents for diabetes follow-up.   [name] states he is doing well with no particular complaints.  He is on maximal doses of metformin XR and Gucotrol XL; he reports good medication compliance with both but later states he may miss an occasional dose, perhaps once weekly.  Pt occasionally checks his blood sugars at home in the morning, the last time was Monday or Tuesday of this week.  States that when he does check, blood sugars are 230s-290s.  He reports some recent dietary indiscretion as he has been living with his nieces and nephews this summer who eat lots of sweets including Twinkies and freeze pops.   Pt does not check his blood pressure at home but takes his medicines (clonidine, lisinopril, HCTZ) as prescribed.  He is willing to walk to [facility] which is 5 blocks from his house in the future to check his blood pressure as well as get some exercise.  He understands that he needs to be resting in a seated position for at least 5 minutes before checking his blood pressure.   Pt still has chronic knee pain thought due to patellar bursitis which causes him to be less active than he would be otherwise.  He was prescribed mobic and told to use ice per Dr. Donnelly Stager note from 3/14 but these therapies provided little relief.  The pain has not gotten worse but continues to be an issue.  Pt states that he took one of his sister's percocet for the pain which worked well and is asking for a prescription for that today.   In terms of his depression, pt states he is doing much better than he was last spring when he was "like an eggshell."  States he never took Prozac as he prefers to get through things on his own.  He is  still living with his brother along with some of his nieces and nephews this summer.  He was slightly teary-eyed when talking about his "rough patch" last spring.  He is eating and sleeping well, has good energy and is enjoying his life, not feeling guilty or expressing any SI today.     Past Medical History  Diagnosis Date  . Diabetes mellitus   . Hypertension   . Hyperlipidemia   . Morbid obesity   . Constipation   . History of chest pain    Current Outpatient Prescriptions  Medication Sig Dispense Refill  . aspirin 81 MG tablet Take 81 mg by mouth daily.        . Blood Glucose Monitoring Suppl (TRUE TRACK BLOOD GLUCOSE) DEVI 1 each by Does not apply route 2 (two) times daily.        . cloNIDine (CATAPRES) 0.1 MG tablet Take 1 tablet (0.1 mg total) by mouth 2 (two) times daily.  60 tablet  6  . glipiZIDE (GLUCOTROL XL) 10 MG 24 hr tablet Take 2 tablets (20 mg total) by mouth daily.  180 tablet  0  . glucose blood test strip 1 each by Other route as needed. Use as instructed       . hydrochlorothiazide (HYDRODIURIL) 25 MG tablet Take 1 tablet (25 mg total) by mouth daily.  30 tablet  6  . LANCETS ULTRA THIN 30G MISC 1 each by Does not apply route 2 (two) times daily.        Marland Kitchen lisinopril (PRINIVIL,ZESTRIL) 40 MG tablet Take 2 tablets (80 mg total) by mouth daily.  60 tablet  6  . metFORMIN (GLUCOPHAGE-XR) 500 MG 24 hr tablet Take 4 tablets (2,000 mg total) by mouth daily with breakfast.  120 tablet  11  . polyethylene glycol (MIRALAX / GLYCOLAX) packet Take 17 g by mouth daily.        . Insulin Glargine (LANTUS SOLOSTAR) 100 UNIT/ML SOPN Inject 8 Units into the skin daily.  15 mL  0  . Insulin Pen Needle 31G X 5 MM MISC Use once daily. DX 250.00  100 each  0  . traMADol (ULTRAM) 50 MG tablet Take 1 tablet (50 mg total) by mouth every 8 (eight) hours as needed for pain.  20 tablet  0   No current facility-administered medications for this visit.   Family History  Problem Relation Age of  Onset  . Prostate cancer Neg Hx   . Diabetes Father   . Diabetes Maternal Grandmother    History   Social History  . Marital Status: Single    Spouse Name: N/A    Number of Children: N/A  . Years of Education: N/A   Social History Main Topics  . Smoking status: Former Games developer  . Smokeless tobacco: Not on file  . Alcohol Use: 9.0 oz/week    15 Cans of beer per week     Comment: Drinks a pt of [employer] q month  . Drug Use: No     Comment: uses marijuana occasionally.  Past cocaine use but currently clean  . Sexual Activity: Not on file   Other Topics Concern  . Not on file   Social History Narrative   Single, 1 daughter and 4 grandsons in good health.  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   Review of Systems: Review of Systems  Constitutional: Negative for fever and weight loss.  HENT: Negative for congestion.   Eyes: Negative for blurred vision.  Respiratory: Negative for cough and shortness of breath.   Cardiovascular: Negative for chest pain, palpitations and leg swelling.  Gastrointestinal: Negative for nausea, vomiting, abdominal pain, diarrhea and constipation.  Genitourinary: Negative for dysuria.  Musculoskeletal: Positive for joint pain.  Neurological: Negative for dizziness, tingling, tremors, loss of consciousness, weakness and headaches.    Objective:  Physical Exam: Filed Vitals:   10/09/12 1027 10/09/12 1122  BP: 107/103 144/102  Pulse: 94 93  Temp: 97.6 F (36.4 C)   TempSrc: Oral   Weight: 272 lb 1.6 oz (123.424 kg)   SpO2: 97%    General: alert, cooperative, and in no apparent distress HEENT: vision grossly intact, oropharynx clear and non-erythematous  Neck: supple, no lymphadenopathy, JVD, or carotid bruits Lungs: clear to ascultation bilaterally, normal work of respiration, no wheezes, rales, ronchi Heart: regular rate and rhythm, no murmurs, gallops, or rubs Abdomen: soft, non-tender, non-distended, normal bowel  sounds Extremities: tenderness to palpation over patellar tendon bilaterally, no erythema, warmth, edema; no cyanosis, clubbing, or edema Neurologic: alert & oriented X3, cranial nerves II-XII intact, strength grossly intact, sensation intact to light touch  Assessment & Plan:  Patient discussed with Dr. Meredith Pel.  Please see problem-based assessment and plan.

## 2012-10-10 ENCOUNTER — Encounter: Payer: Self-pay | Admitting: Internal Medicine

## 2012-10-10 LAB — MICROALBUMIN / CREATININE URINE RATIO
Creatinine, Urine: 77.4 mg/dL
Microalb Creat Ratio: 128.6 mg/g — ABNORMAL HIGH (ref 0.0–30.0)
Microalb, Ur: 9.95 mg/dL — ABNORMAL HIGH (ref 0.00–1.89)

## 2012-10-10 NOTE — Assessment & Plan Note (Addendum)
Stable, pt eating and sleeping well, has good energy and is enjoying his life, not feeling guilty or expressing any SI today.  States he never took Prozac that was prescribed last spring.  He is no longer taking Ambien and is sleeping about 7 hours/night.

## 2012-10-10 NOTE — Assessment & Plan Note (Addendum)
Pt no longer on statin therapy (?).   -FLP today -will likely add statin back on at next visit in 2 weeks  ADDENDUM: Pt's LDL 178, will add statin at f/u visit

## 2012-10-10 NOTE — Assessment & Plan Note (Signed)
Emphasized importance of weight loss as this will help with his other chronic medical problems. Pt has met with Lupita Leash Plyler in the past to discuss healthy eating for diabetics, we continued that discussed today. Pt states he knows what he needs to do but admits to dietary indiscretion this summer since he has been living with his nieces and nephews who eat a lot of sweets. Hopefully reducing his knee pain will also allow him to exercise more.

## 2012-10-10 NOTE — Progress Notes (Signed)
I saw and evaluated the patient.  I personally confirmed the key portions of the history and exam documented by Dr. Rogers and I reviewed pertinent patient test results.  The assessment, diagnosis, and plan were formulated together and I agree with the documentation in the resident's note. 

## 2012-10-10 NOTE — Assessment & Plan Note (Addendum)
Prepatellar bursitis still causing pt pain and inhibiting him from exercising.  Mobic and ice did not provide relief.  Tenderness to palpation inferior to patella bilaterally but no erythema, warmth, edema.   -trial of tramadol 50 mg q8h prn pain -re-evaluate at next visit

## 2012-10-24 ENCOUNTER — Ambulatory Visit (INDEPENDENT_AMBULATORY_CARE_PROVIDER_SITE_OTHER): Payer: No Typology Code available for payment source | Admitting: Internal Medicine

## 2012-10-24 ENCOUNTER — Encounter: Payer: Self-pay | Admitting: Internal Medicine

## 2012-10-24 ENCOUNTER — Ambulatory Visit: Payer: No Typology Code available for payment source | Admitting: Internal Medicine

## 2012-10-24 VITALS — BP 146/101 | HR 89 | Temp 97.0°F | Wt 275.3 lb

## 2012-10-24 DIAGNOSIS — I10 Essential (primary) hypertension: Secondary | ICD-10-CM

## 2012-10-24 DIAGNOSIS — Z0000 Encounter for general adult medical examination without abnormal findings: Secondary | ICD-10-CM

## 2012-10-24 DIAGNOSIS — E785 Hyperlipidemia, unspecified: Secondary | ICD-10-CM

## 2012-10-24 DIAGNOSIS — M76899 Other specified enthesopathies of unspecified lower limb, excluding foot: Secondary | ICD-10-CM

## 2012-10-24 DIAGNOSIS — M7050 Other bursitis of knee, unspecified knee: Secondary | ICD-10-CM

## 2012-10-24 DIAGNOSIS — F329 Major depressive disorder, single episode, unspecified: Secondary | ICD-10-CM

## 2012-10-24 DIAGNOSIS — E119 Type 2 diabetes mellitus without complications: Secondary | ICD-10-CM

## 2012-10-24 MED ORDER — SIMVASTATIN 40 MG PO TABS: 40.0000 mg | ORAL_TABLET | Freq: Every evening | ORAL | Status: DC

## 2012-10-24 MED ORDER — NAPROXEN 500 MG PO TABS: 500.0000 mg | ORAL_TABLET | Freq: Two times a day (BID) | ORAL | Status: DC

## 2012-10-24 NOTE — Assessment & Plan Note (Signed)
Pt states he has simvistatin at home but stopped taking it a while ago.  Explained that LDL elevated on labs from last visit.  Pt amenable to restarting statin, gave him rx for refills today so he will not run out.

## 2012-10-24 NOTE — Assessment & Plan Note (Signed)
BP Readings from Last 3 Encounters:  10/24/12 146/101  10/09/12 144/102  06/06/12 155/103    Lab Results  Component Value Date   NA 134* 10/09/2012   K 4.6 10/09/2012   CREATININE 0.73 10/09/2012    Assessment: BP unchanged from last visit, relatively well-controlled.  We are already changing insulin therapy today and adding statin back on to medication regimen thus will continue to hold off on additional antihypertensive today.  Pt states he will start going to local pharmacy to check his blood pressure and record in log to bring to next visit.  Blood pressure control: mildly elevated Progress toward BP goal:  unchanged  Plan: Encouraged weight loss; if pt able to lose 5-10 pounds, his BP may reach goal without adding another antihypertensive.  If not, would consider adding amlodipine 5mg  daily at next visit.  Medications:  continue current medications Educational resources provided: brochure Self management tools provided: home blood pressure logbook

## 2012-10-24 NOTE — Assessment & Plan Note (Addendum)
Pt still having knee pain but improved since last visit.  He has not had tramadol filled due to financial constraints but states he has the money together to do so today.  Explained to pt that best to take naproxen 500 mg BID to decrease inflammation and only use tramadol prn temporarily for breakthrough pain.    -re-evaluate at next visit, favor not refilling tramadol at that time

## 2012-10-24 NOTE — Assessment & Plan Note (Addendum)
Lab Results  Component Value Date   HGBA1C 11.7 10/09/2012   HGBA1C 11.8 05/26/2012   HGBA1C 9.3 11/21/2011     Assessment: Pt doing well using his Lantus pen properly, injecting 8 units daily; also reports good compliance with metformin and glipizide.  He is working on his diet as well and reports fewer sweets since last visit.  Blood sugars continuing to run high (and no episodes of hypoglycemia) per glucometer so need to increase insulin dose. Discussed option of switching to Levemir so needles would be included, but pt prefers to stick with Lantus and states that cost of needles should not be a problem.  Pt states he will go to [facility] today to file paperwork so they can order insulin for him.   Pt's urine microalbumin:creatinine increased at last visit, he is already on lisinopril.  Diabetes control: poor control (HgbA1C >9%) Progress toward A1C goal:  unable to assess  Plan: Increase Lantus to 15 units daily, discussed symptoms of hypoglycemia as well as provided pt with information in AVS.  Sent message to [facility] to call and check on pt late next week.  Pt will let us know if he has trouble getting his insulin ordered or if he runs out of insulin before [facility] gets it in for him.  Pt reminded to bring his medicines and glucometer to next visit so we can adjust insulin regimen appropriately.  Medications:  continue current medications- increase Lantus to 15 units daily Home glucose monitoring:  Frequency:  at least daily  Timing:  always fasting morning BG Instruction/counseling given: reminded to bring blood glucose meter & log to each visit Educational resources provided: brochure Self management tools provided: copy of home glucose meter download

## 2012-10-24 NOTE — Assessment & Plan Note (Signed)
Pt actively working on weight loss.  Reports reduced dietary indiscretion since last visit.

## 2012-10-24 NOTE — Assessment & Plan Note (Signed)
Stable on no medication. 

## 2012-10-24 NOTE — Patient Instructions (Addendum)
Please follow-up with Dr. Glendell Docker in October.  Be sure you get to the [facility] program in the next few days so they can get insulin for you.  However, if you run out of insulin before you get more from [facility], please let us know.  Keep up the good work on your diabetes and diet!  We are increasing your insulin to 15 units daily. Lupita Leash will call you in 1 week to see how you are doing with this dose.  Be sure to let us know if you have any symptoms of low blood sugar as we discussed and as written below.   Remember to go to the [facility] and check you blood pressure daily if possible.  You need to rest for 5 minutes before measuring and be sure to write down the numbers in the log we have you last time.   We would like you to restart your SIMVISTATIN for cholesterol.    For your knees, you may take TRAMADOL as needed but you should also take NAPROSYN two times daily to work on the inflammation that is causing the pain.    Low Blood Sugar Low blood sugar (hypoglycemia) means that the level of sugar in your blood is lower than it should be. Signs of low blood sugar include:  Getting sweaty.  Feeling hungry.  Feeling dizzy or weak.  Feeling sleepier than normal.  Feeling nervous.  Headaches.  Having a fast heartbeat. Low blood sugar can happen fast and can be an emergency. Your doctor can do tests to check your blood sugar level. You can have low blood sugar and not have diabetes. HOME CARE  Check your blood sugar as told by your doctor. If it is less than 70 mg/dl or as told by your doctor, take 1 of the following:  3 to 4 glucose tablets.   cup clear juice.   cup soda pop, not diet.  1 cup milk.  5 to 6 hard candies.  Recheck blood sugar after 15 minutes. Repeat until it is at the right level.  Eat a snack if it is more than 1 hour until the next meal.  Only take medicine as told by your doctor.  Do not skip meals. Eat on time.  Do not drink alcohol except with  meals.  Check your blood glucose before driving.  Check your blood glucose before and after exercise.  Always carry treatment with you, such as glucose pills.  Always wear a medical alert bracelet if you have diabetes. GET HELP RIGHT AWAY IF:   Your blood glucose goes below 70 mg/dl or as told by your doctor, and you:  Are confused.  Are not able to swallow.  Pass out (faint).  You cannot treat yourself. You may need someone to help you.  You have low blood sugar problems often.  You have problems from your medicines.  You are not feeling better after 3 to 4 days.  You have vision changes. MAKE SURE YOU:   Understand these instructions.  Will watch this condition.  Will get help right away if you are not doing well or get worse. Document Released: 05/09/2009 Document Revised: 05/07/2011 Document Reviewed: 05/09/2009 [facility] Patient Information 2014 [place], [place].

## 2012-10-24 NOTE — Progress Notes (Signed)
Patient ID: Glenn Washington, male   DOB: [birth year], 57 y.o.   MRN: [id number]   Subjective:   Patient ID: Glenn Washington male   DOB: [birth year] 57 y.o.   MRN: [id number]  HPI: [name] is a 57 y.o. man with history of poorly controlled DM2, hypertension, bilateral knee prepatellar bursitis who presents for diabetes follow-up.   Pt states he is doing quite well.  He has been injecting Lantus 8 units daily (started at last visit) and checking his blood sugars regularly.  He also reports perfect compliance with metformin and glipizide.  He brought in all of his medications and his glucometer today.  Average blood glucose 295 (average after dinner 344, morning fasting 282), no hypoglycemic reads.  His nieces and nephews have now moved out of his brother's house so he has been able to be better about his diet, even turned down peach cobbler that his cousin made last week.  No symptoms of hyper/hypoglycemia.   He has not been checking his BP at home.  Has not started walking to [facility] to check it as we discussed last time because he has still been having some pain in his knees (see below); reiterated importance of resting 5-10 minutes before checking BP.  Reports good medication compliance with HCTZ, lisinopril, and clonidine. No headaches, dizziness.    He is still having some pain in his knees but improved since last visit.  States that he has not been able to get tramadol filled yet due to financial constraints but he plans to go today.  Thinks that with less pain, he will be able to exercise more and lose weight as well as walk the five blocks to [facility] to check his BP.   His mood has been good.  He seems quite motivated to get his chronic medical problems under control with good medication compliance, diet, and exercise.    Past Medical History  Diagnosis Date  . Diabetes mellitus   . Hypertension   . Hyperlipidemia   . Morbid obesity   . Constipation   . History of chest pain     Current Outpatient Prescriptions  Medication Sig Dispense Refill  . aspirin 81 MG tablet Take 81 mg by mouth daily.        . Blood Glucose Monitoring Suppl (TRUE TRACK BLOOD GLUCOSE) DEVI 1 each by Does not apply route 2 (two) times daily.        . cloNIDine (CATAPRES) 0.1 MG tablet Take 1 tablet (0.1 mg total) by mouth 2 (two) times daily.  60 tablet  6  . glipiZIDE (GLUCOTROL XL) 10 MG 24 hr tablet Take 2 tablets (20 mg total) by mouth daily.  180 tablet  0  . glucose blood test strip 1 each by Other route as needed. Use as instructed       . hydrochlorothiazide (HYDRODIURIL) 25 MG tablet Take 1 tablet (25 mg total) by mouth daily.  30 tablet  6  . Insulin Glargine (LANTUS SOLOSTAR) 100 UNIT/ML SOPN Inject 8 Units into the skin daily.  15 mL  0  . Insulin Pen Needle 31G X 5 MM MISC Use once daily. DX 250.00  100 each  0  . LANCETS ULTRA THIN 30G MISC 1 each by Does not apply route 2 (two) times daily.        Marland Kitchen lisinopril (PRINIVIL,ZESTRIL) 40 MG tablet Take 2 tablets (80 mg total) by mouth daily.  60 tablet  6  . metFORMIN (GLUCOPHAGE-XR) 500 MG 24  hr tablet Take 4 tablets (2,000 mg total) by mouth daily with breakfast.  120 tablet  11  . polyethylene glycol (MIRALAX / GLYCOLAX) packet Take 17 g by mouth daily.        . naproxen (NAPROSYN) 500 MG tablet Take 1 tablet (500 mg total) by mouth 2 (two) times daily with a meal.  60 tablet  2  . simvastatin (ZOCOR) 40 MG tablet Take 1 tablet (40 mg total) by mouth every evening.  30 tablet  11  . traMADol (ULTRAM) 50 MG tablet Take 1 tablet (50 mg total) by mouth every 8 (eight) hours as needed for pain.  20 tablet  0   No current facility-administered medications for this visit.   Family History  Problem Relation Age of Onset  . Prostate cancer Neg Hx   . Diabetes Father   . Diabetes Maternal Grandmother    History   Social History  . Marital Status: Single    Spouse Name: N/A    Number of Children: N/A  . Years of Education: N/A    Social History Main Topics  . Smoking status: Former Games developer  . Smokeless tobacco: None  . Alcohol Use: 9.0 oz/week    15 Cans of beer per week     Comment: Drinks a pt of [employer] q month  . Drug Use: No     Comment: uses marijuana occasionally.  Past cocaine use but currently clean  . Sexual Activity: None   Other Topics Concern  . None   Social History Narrative   Single, 1 daughter and 4 grandsons in good health.  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   Review of Systems: Review of Systems  Constitutional: Negative for fever, chills, weight loss, malaise/fatigue and diaphoresis.  Eyes: Negative for blurred vision.  Respiratory: Negative for cough and shortness of breath.   Cardiovascular: Negative for chest pain, palpitations and leg swelling.  Gastrointestinal: Negative for nausea, vomiting, abdominal pain, diarrhea, constipation and blood in stool.  Genitourinary: Negative for dysuria.  Musculoskeletal: Positive for joint pain. Negative for back pain and falls.  Neurological: Negative for dizziness, tingling, loss of consciousness, weakness and headaches.  Endo/Heme/Allergies: Negative for polydipsia.    Objective:  Physical Exam: Filed Vitals:   10/24/12 1017  BP: 146/101  Pulse: 89  Temp: 97 F (36.1 C)  TempSrc: Oral  Weight: 275 lb 4.8 oz (124.875 kg)  SpO2: 97%   General: alert, cooperative, and in no apparent distress HEENT: vision grossly intact, oropharynx clear and non-erythematous  Neck: supple, no lymphadenopathy Lungs: clear to ascultation bilaterally, normal work of respiration, no wheezes, rales, ronchi Heart: regular rate and rhythm, no murmurs, gallops, or rubs Abdomen: soft, non-tender, non-distended, normal bowel sounds Extremities: no cyanosis, clubbing, or edema Neurologic: alert & oriented X3, cranial nerves II-XII intact, strength grossly intact, sensation intact to light touch  Assessment & Plan:  Patient discussed with  Dr. Rogelia Boga.  Please see problem-based assessment and plan.

## 2012-10-24 NOTE — Assessment & Plan Note (Signed)
Needs flu shot at follow-up with PCP in October.

## 2012-10-26 NOTE — Progress Notes (Signed)
I saw and evaluated the patient.  I personally confirmed the key portions of the history and exam documented by Dr. Rogers and I reviewed pertinent patient test results.  The assessment, diagnosis, and plan were formulated together and I agree with the documentation in the resident's note. 

## 2012-10-31 ENCOUNTER — Telehealth: Payer: Self-pay | Admitting: Dietician

## 2012-10-31 DIAGNOSIS — E119 Type 2 diabetes mellitus without complications: Secondary | ICD-10-CM

## 2012-10-31 NOTE — Telephone Encounter (Signed)
Calling to follow up on insulin change and to be sure patient was able to obtain insulin through [facility].

## 2012-11-10 MED ORDER — INSULIN GLARGINE 100 UNIT/ML SOLOSTAR PEN: 21.0000 [IU] | PEN_INJECTOR | Freq: Every day | SUBCUTANEOUS | Status: DC

## 2012-11-10 NOTE — Telephone Encounter (Signed)
Has appointment to go to [facility] for Lantus insulin tomorrow. Has been taking 15 units Lantus ~1 pm daily and checking blood sugars first thing in the moring. His blood sugar readings have been 189- 220 and none below 150 mg/dl.   He agreed to increase to 21 units daily ( increase by 6 units) per protocol if you agree:  if Average of past 2 days fasting blood sugar is < 110- no change; 111-130 increase by 2 units; 131-150 increase by 4 units; 151-190 increase by 6 units;  >190 increase by 8 units.   Patient to call if he is unable to obtain insulin.

## 2012-11-11 ENCOUNTER — Other Ambulatory Visit: Payer: Self-pay | Admitting: Internal Medicine

## 2012-12-09 ENCOUNTER — Ambulatory Visit (INDEPENDENT_AMBULATORY_CARE_PROVIDER_SITE_OTHER): Payer: No Typology Code available for payment source | Admitting: Internal Medicine

## 2012-12-09 ENCOUNTER — Encounter: Payer: Self-pay | Admitting: Internal Medicine

## 2012-12-09 VITALS — BP 138/92 | HR 110 | Temp 97.2°F | Wt 280.5 lb

## 2012-12-09 DIAGNOSIS — M25561 Pain in right knee: Secondary | ICD-10-CM

## 2012-12-09 DIAGNOSIS — Z23 Encounter for immunization: Secondary | ICD-10-CM

## 2012-12-09 DIAGNOSIS — M25569 Pain in unspecified knee: Secondary | ICD-10-CM

## 2012-12-09 DIAGNOSIS — I10 Essential (primary) hypertension: Secondary | ICD-10-CM

## 2012-12-09 DIAGNOSIS — E119 Type 2 diabetes mellitus without complications: Secondary | ICD-10-CM

## 2012-12-09 MED ORDER — INSULIN GLARGINE 100 UNIT/ML SOLOSTAR PEN: 21.0000 [IU] | PEN_INJECTOR | Freq: Every day | SUBCUTANEOUS | Status: DC

## 2012-12-09 NOTE — Patient Instructions (Addendum)
We have increased your Lantus to 21 U once daily. We have given you a sample of 5 pens of Lantus that will last until you are able to get the Lantus pens through [facility]. We have also given you pen needles to use. [facility] will not be able to get you pen needles for free. You will need to find a a way to purchase the pen needles.  For your knee pain, we are referring you to physical therapy. Also, pelase take tylenol 100 mg up to 4 times per day if the pain is really bad.  F/U with PCP in 1 month   Knee Pain The knee is the complex joint between your thigh and your lower leg. It is made up of bones, tendons, ligaments, and cartilage. The bones that make up the knee are:  The femur in the thigh.  The tibia and fibula in the lower leg.  The patella or kneecap riding in the groove on the lower femur. CAUSES  Knee pain is a common complaint with many causes. A few of these causes are:  Injury, such as:  A ruptured ligament or tendon injury.  Torn cartilage.  Medical conditions, such as:  Gout  Arthritis  Infections  Overuse, over training or overdoing a physical activity. Knee pain can be minor or severe. Knee pain can accompany debilitating injury. Minor knee problems often respond well to self-care measures or get well on their own. More serious injuries may need medical intervention or even surgery. SYMPTOMS The knee is complex. Symptoms of knee problems can vary widely. Some of the problems are:  Pain with movement and weight bearing.  Swelling and tenderness.  Buckling of the knee.  Inability to straighten or extend your knee.  Your knee locks and you cannot straighten it.  Warmth and redness with pain and fever.  Deformity or dislocation of the kneecap. DIAGNOSIS  Determining what is wrong may be very straight forward such as when there is an injury. It can also be challenging because of the complexity of the knee. Tests to make a diagnosis may include:  Your caregiver  taking a history and doing a physical exam.  Routine X-rays can be used to rule out other problems. X-rays will not reveal a cartilage tear. Some injuries of the knee can be diagnosed by:  Arthroscopy a surgical technique by which a small video camera is inserted through tiny incisions on the sides of the knee. This procedure is used to examine and repair internal knee joint problems. Tiny instruments can be used during arthroscopy to repair the torn knee cartilage (meniscus).  Arthrography is a radiology technique. A contrast liquid is directly injected into the knee joint. Internal structures of the knee joint then become visible on X-ray film.  An MRI scan is a non x-ray radiology procedure in which magnetic fields and a computer produce two- or three-dimensional images of the inside of the knee. Cartilage tears are often visible using an MRI scanner. MRI scans have largely replaced arthrography in diagnosing cartilage tears of the knee.  Blood work.  Examination of the fluid that helps to lubricate the knee joint (synovial fluid). This is done by taking a sample out using a needle and a syringe. TREATMENT The treatment of knee problems depends on the cause. Some of these treatments are:  Depending on the injury, proper casting, splinting, surgery or physical therapy care will be needed.  Give yourself adequate recovery time. Do not overuse your joints. If you  begin to get sore during workout routines, back off. Slow down or do fewer repetitions.  For repetitive activities such as cycling or running, maintain your strength and nutrition.  Alternate muscle groups. For example if you are a weight lifter, work the upper body on one day and the lower body the next.  Either tight or weak muscles do not give the proper support for your knee. Tight or weak muscles do not absorb the stress placed on the knee joint. Keep the muscles surrounding the knee strong.  Take care of mechanical  problems.  If you have flat feet, orthotics or special shoes may help. See your caregiver if you need help.  Arch supports, sometimes with wedges on the inner or outer aspect of the heel, can help. These can shift pressure away from the side of the knee most bothered by osteoarthritis.  A brace called an "unloader" brace also may be used to help ease the pressure on the most arthritic side of the knee.  If your caregiver has prescribed crutches, braces, wraps or ice, use as directed. The acronym for this is PRICE. This means protection, rest, ice, compression and elevation.  Nonsteroidal anti-inflammatory drugs (NSAID's), can help relieve pain. But if taken immediately after an injury, they may actually increase swelling. Take NSAID's with food in your stomach. Stop them if you develop stomach problems. Do not take these if you have a history of ulcers, stomach pain or bleeding from the bowel. Do not take without your caregiver's approval if you have problems with fluid retention, heart failure, or kidney problems.  For ongoing knee problems, physical therapy may be helpful.  Glucosamine and chondroitin are over-the-counter dietary supplements. Both may help relieve the pain of osteoarthritis in the knee. These medicines are different from the usual anti-inflammatory drugs. Glucosamine may decrease the rate of cartilage destruction.  Injections of a corticosteroid drug into your knee joint may help reduce the symptoms of an arthritis flare-up. They may provide pain relief that lasts a few months. You may have to wait a few months between injections. The injections do have a small increased risk of infection, water retention and elevated blood sugar levels.  Hyaluronic acid injected into damaged joints may ease pain and provide lubrication. These injections may work by reducing inflammation. A series of shots may give relief for as long as 6 months.  Topical painkillers. Applying certain ointments  to your skin may help relieve the pain and stiffness of osteoarthritis. Ask your pharmacist for suggestions. Many over the-counter products are approved for temporary relief of arthritis pain.  In some countries, doctors often prescribe topical NSAID's for relief of chronic conditions such as arthritis and tendinitis. A review of treatment with NSAID creams found that they worked as well as oral medications but without the serious side effects. PREVENTION  Maintain a healthy weight. Extra pounds put more strain on your joints.  Get strong, stay limber. Weak muscles are a common cause of knee injuries. Stretching is important. Include flexibility exercises in your workouts.  Be smart about exercise. If you have osteoarthritis, chronic knee pain or recurring injuries, you may need to change the way you exercise. This does not mean you have to stop being active. If your knees ache after jogging or playing basketball, consider switching to swimming, water aerobics or other low-impact activities, at least for a few days a week. Sometimes limiting high-impact activities will provide relief.  Make sure your shoes fit well. Choose footwear that is  right for your sport.  Protect your knees. Use the proper gear for knee-sensitive activities. Use kneepads when playing volleyball or laying carpet. Buckle your seat belt every time you drive. Most shattered kneecaps occur in car accidents.  Rest when you are tired. SEEK MEDICAL CARE IF:  You have knee pain that is continual and does not seem to be getting better.  SEEK IMMEDIATE MEDICAL CARE IF:  Your knee joint feels hot to the touch and you have a high fever. MAKE SURE YOU:   Understand these instructions.  Will watch your condition.  Will get help right away if you are not doing well or get worse. Document Released: 12/10/2006 Document Revised: 05/07/2011 Document Reviewed: 12/10/2006 [facility] Patient Information 2014 [place], [place].

## 2012-12-10 NOTE — Assessment & Plan Note (Signed)
Lab Results  Component Value Date   HGBA1C 11.7 10/09/2012   HGBA1C 11.8 05/26/2012   HGBA1C 9.3 11/21/2011     Assessment: Diabetes control: fair control Progress toward A1C goal:  unchanged Comments: The patient is struggling to afford his insulin. He reports that he has not been using his insulin as prescribed because he is trying to make it last. He is waiting for the [facility] to get him free medications. In the meantime, he requests a insulin pen and pen needle sample to hold him over.  Plan: Medications:  continue current medications Home glucose monitoring: Frequency: 3 times a day Timing: before meals Instruction/counseling given: reminded to get eye exam, reminded to bring blood glucose meter & log to each visit, reminded to bring medications to each visit, discussed foot care and discussed the need for weight loss Educational resources provided: brochure Self management tools provided: home glucose logbook Other plans: The patient was instructed to increase his lantus to 21 units daily. He was given 5 insulin pens (1 box). He was also given a box of insulin pen needles. He was told that he will not be able to get these samples in the future. Furthermore, the [facility] program will NOT be giving him free pen needles. He will need to find a way to get these on his own. Plan for f/u in 1 month.

## 2012-12-10 NOTE — Assessment & Plan Note (Signed)
BP Readings from Last 3 Encounters:  12/09/12 138/92  10/24/12 146/101  10/09/12 144/102    Lab Results  Component Value Date   NA 134* 10/09/2012   K 4.6 10/09/2012   CREATININE 0.73 10/09/2012    Assessment: Blood pressure control: mildly elevated Progress toward BP goal:  improved Comments: Doing well.  Plan: Medications:  continue current medications Educational resources provided: brochure Self management tools provided: home blood pressure logbook Other plans: F/U in 1 month.

## 2012-12-10 NOTE — Progress Notes (Signed)
Subjective:   Patient ID: Glenn Washington male   DOB: [birth year] 57 y.o.   MRN: [id number]  HPI: [name] is a 57 y.o. man who comes to clinic for f/u regarding his diabetes.   Please see A+P for problem based charting of chronic medical problems.     Past Medical History  Diagnosis Date  . Diabetes mellitus   . Hypertension   . Hyperlipidemia   . Morbid obesity   . Constipation   . History of chest pain    Current Outpatient Prescriptions  Medication Sig Dispense Refill  . aspirin 81 MG tablet Take 81 mg by mouth daily.        . Blood Glucose Monitoring Suppl (TRUE TRACK BLOOD GLUCOSE) DEVI 1 each by Does not apply route 2 (two) times daily.        . cloNIDine (CATAPRES) 0.1 MG tablet Take 1 tablet (0.1 mg total) by mouth 2 (two) times daily.  60 tablet  6  . glipiZIDE (GLUCOTROL XL) 10 MG 24 hr tablet Take 2 tablets (20 mg total) by mouth daily.  180 tablet  0  . glucose blood test strip 1 each by Other route as needed. Use as instructed       . hydrochlorothiazide (HYDRODIURIL) 25 MG tablet Take 1 tablet (25 mg total) by mouth daily.  30 tablet  6  . Insulin Glargine (LANTUS SOLOSTAR) 100 UNIT/ML SOPN Inject 21 Units into the skin daily.  15 mL  0  . Insulin Pen Needle 31G X 5 MM MISC Use once daily. DX 250.00  100 each  0  . LANCETS ULTRA THIN 30G MISC 1 each by Does not apply route 2 (two) times daily.        Marland Kitchen lisinopril (PRINIVIL,ZESTRIL) 40 MG tablet Take 2 tablets (80 mg total) by mouth daily.  60 tablet  6  . metFORMIN (GLUCOPHAGE-XR) 500 MG 24 hr tablet Take 4 tablets (2,000 mg total) by mouth daily with breakfast.  120 tablet  11  . naproxen (NAPROSYN) 500 MG tablet Take 1 tablet (500 mg total) by mouth 2 (two) times daily with a meal.  60 tablet  2  . polyethylene glycol (MIRALAX / GLYCOLAX) packet Take 17 g by mouth daily.        . simvastatin (ZOCOR) 40 MG tablet Take 1 tablet (40 mg total) by mouth every evening.  30 tablet  11  . traMADol (ULTRAM) 50 MG  tablet Take 1 tablet (50 mg total) by mouth every 8 (eight) hours as needed for pain.  20 tablet  0   No current facility-administered medications for this visit.   Family History  Problem Relation Age of Onset  . Prostate cancer Neg Hx   . Diabetes Father   . Diabetes Maternal Grandmother    History   Social History  . Marital Status: Single    Spouse Name: N/A    Number of Children: N/A  . Years of Education: N/A   Social History Main Topics  . Smoking status: Former Games developer  . Smokeless tobacco: None  . Alcohol Use: 9.0 oz/week    15 Cans of beer per week     Comment: Drinks a pt of [employer] q month  . Drug Use: No     Comment: uses marijuana occasionally.  Past cocaine use but currently clean  . Sexual Activity: None   Other Topics Concern  . None   Social History Narrative   Single, 1 daughter  and 4 grandsons in good health.  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   Review of Systems: Review of Systems  Constitutional: Negative for fever, chills, weight loss, malaise/fatigue and diaphoresis.  HENT: Negative for sore throat.   Eyes: Positive for blurred vision. Negative for double vision and photophobia.  Respiratory: Negative for cough, shortness of breath and wheezing.   Cardiovascular: Negative for chest pain, palpitations, orthopnea and leg swelling.  Gastrointestinal: Negative for heartburn, nausea, vomiting, abdominal pain, diarrhea and constipation.  Genitourinary: Negative for dysuria, urgency and frequency.  Musculoskeletal: Negative for back pain and myalgias.  Skin: Negative for itching and rash.  Neurological: Negative for weakness.    Objective:  Physical Exam: Filed Vitals:   12/09/12 1624  BP: 138/92  Pulse: 110  Temp: 97.2 F (36.2 C)  TempSrc: Oral  Weight: 280 lb 8 oz (127.234 kg)  SpO2: 97%   Physical Exam  Constitutional: He appears well-developed and well-nourished. No distress.  HENT:  Head: Normocephalic.    Mouth/Throat: Oropharynx is clear and moist. No oropharyngeal exudate.  Eyes: EOM are normal. Pupils are equal, round, and reactive to light.  Cardiovascular: Normal rate, regular rhythm, normal heart sounds and intact distal pulses.  Exam reveals no gallop and no friction rub.   No murmur heard. Pulmonary/Chest: Effort normal and breath sounds normal. No respiratory distress. He has no wheezes. He has no rales. He exhibits no tenderness.  Abdominal: He exhibits no distension. There is no tenderness. There is no rebound.  Musculoskeletal:  Tender to palpation along the patientes joint line (knee). Right >Left. No swelling, warmness, erythema. Full ROM.  Skin: He is not diaphoretic.     Assessment & Plan:

## 2012-12-10 NOTE — Assessment & Plan Note (Signed)
A: The patients knee pain is not concerning for infectious or inflammatory arthropathy. He has tried Aleve and tramadol with no relief. He requests a referral to physical therapy.  P: Per the patients request, we refered him to physical therapy. Also, he was instructed to take tylenol 100 mg up to 4 times per day if the pain is worsens. Lastly, we discussed the benefits of weight loss for knee pain. We plan to see the patient in 1 month for f/u.

## 2012-12-11 NOTE — Progress Notes (Signed)
I saw and evaluated the patient.  I personally confirmed the key portions of Dr. Komanski's history and exam and reviewed pertinent patient test results.  The assessment, diagnosis, and plan were formulated together and I agree with the documentation in the resident's note. 

## 2013-01-19 ENCOUNTER — Encounter: Payer: Self-pay | Admitting: *Deleted

## 2013-01-28 ENCOUNTER — Other Ambulatory Visit: Payer: Self-pay | Admitting: *Deleted

## 2013-01-28 DIAGNOSIS — E119 Type 2 diabetes mellitus without complications: Secondary | ICD-10-CM

## 2013-01-28 MED ORDER — GLIPIZIDE ER 10 MG PO TB24: 20.0000 mg | ORAL_TABLET | Freq: Every day | ORAL | Status: DC

## 2013-02-04 ENCOUNTER — Ambulatory Visit: Payer: Self-pay

## 2013-02-23 ENCOUNTER — Ambulatory Visit: Payer: No Typology Code available for payment source | Admitting: Physical Therapy

## 2013-03-03 ENCOUNTER — Ambulatory Visit: Payer: No Typology Code available for payment source | Attending: Internal Medicine | Admitting: Physical Therapy

## 2013-03-03 DIAGNOSIS — IMO0001 Reserved for inherently not codable concepts without codable children: Secondary | ICD-10-CM | POA: Insufficient documentation

## 2013-03-03 DIAGNOSIS — M25569 Pain in unspecified knee: Secondary | ICD-10-CM | POA: Insufficient documentation

## 2013-03-03 DIAGNOSIS — R5381 Other malaise: Secondary | ICD-10-CM | POA: Insufficient documentation

## 2013-03-03 DIAGNOSIS — E119 Type 2 diabetes mellitus without complications: Secondary | ICD-10-CM | POA: Insufficient documentation

## 2013-03-03 DIAGNOSIS — R269 Unspecified abnormalities of gait and mobility: Secondary | ICD-10-CM | POA: Insufficient documentation

## 2013-03-03 DIAGNOSIS — R262 Difficulty in walking, not elsewhere classified: Secondary | ICD-10-CM | POA: Insufficient documentation

## 2013-03-03 DIAGNOSIS — I10 Essential (primary) hypertension: Secondary | ICD-10-CM | POA: Insufficient documentation

## 2013-03-10 ENCOUNTER — Telehealth: Payer: Self-pay | Admitting: Dietician

## 2013-03-10 ENCOUNTER — Other Ambulatory Visit: Payer: Self-pay | Admitting: Internal Medicine

## 2013-03-10 DIAGNOSIS — E119 Type 2 diabetes mellitus without complications: Secondary | ICD-10-CM

## 2013-03-10 DIAGNOSIS — E1110 Type 2 diabetes mellitus with ketoacidosis without coma: Secondary | ICD-10-CM

## 2013-03-10 NOTE — Telephone Encounter (Signed)
Patient agrees to retinal camera eye exam for 03/17/13 at 9:30 AM. 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 Please sign order if you agree. Thanks

## 2013-03-17 ENCOUNTER — Ambulatory Visit: Payer: No Typology Code available for payment source | Admitting: Dietician

## 2013-03-18 ENCOUNTER — Ambulatory Visit: Payer: No Typology Code available for payment source | Admitting: Dietician

## 2013-03-18 ENCOUNTER — Other Ambulatory Visit: Payer: Self-pay | Admitting: Dietician

## 2013-03-18 ENCOUNTER — Encounter: Payer: Self-pay | Admitting: Dietician

## 2013-03-18 DIAGNOSIS — E1110 Type 2 diabetes mellitus with ketoacidosis without coma: Secondary | ICD-10-CM

## 2013-03-18 LAB — HM DIABETES EYE EXAM

## 2013-03-18 NOTE — Progress Notes (Signed)
Retinal photos taken and transmitted successfully. Patient scheduled an appointment to see a doctor in February.

## 2013-03-26 ENCOUNTER — Encounter: Payer: Self-pay | Admitting: Gastroenterology

## 2013-04-07 ENCOUNTER — Encounter: Payer: No Typology Code available for payment source | Admitting: Internal Medicine

## 2013-04-07 ENCOUNTER — Other Ambulatory Visit: Payer: Self-pay | Admitting: *Deleted

## 2013-04-07 DIAGNOSIS — E119 Type 2 diabetes mellitus without complications: Secondary | ICD-10-CM

## 2013-04-08 MED ORDER — METFORMIN HCL ER 500 MG PO TB24: 2000.0000 mg | ORAL_TABLET | Freq: Every day | ORAL | Status: DC

## 2013-04-08 NOTE — Telephone Encounter (Signed)
Pt is out of meds,   PCP on elective

## 2013-04-08 NOTE — Telephone Encounter (Signed)
Rx called in 

## 2013-04-09 NOTE — Telephone Encounter (Signed)
Pt states [facility] has not received Metformin refill which was called in yesterday afternoon; I called it in again today.

## 2013-05-07 ENCOUNTER — Other Ambulatory Visit: Payer: Self-pay | Admitting: *Deleted

## 2013-05-07 DIAGNOSIS — E119 Type 2 diabetes mellitus without complications: Secondary | ICD-10-CM

## 2013-05-07 MED ORDER — GLIPIZIDE ER 10 MG PO TB24: 20.0000 mg | ORAL_TABLET | Freq: Every day | ORAL | Status: DC

## 2013-05-12 ENCOUNTER — Ambulatory Visit (INDEPENDENT_AMBULATORY_CARE_PROVIDER_SITE_OTHER): Payer: No Typology Code available for payment source | Admitting: Internal Medicine

## 2013-05-12 ENCOUNTER — Encounter: Payer: Self-pay | Admitting: Internal Medicine

## 2013-05-12 VITALS — BP 147/100 | HR 101 | Temp 98.1°F | Ht 72.0 in | Wt 278.6 lb

## 2013-05-12 DIAGNOSIS — Z0000 Encounter for general adult medical examination without abnormal findings: Secondary | ICD-10-CM

## 2013-05-12 DIAGNOSIS — E119 Type 2 diabetes mellitus without complications: Secondary | ICD-10-CM

## 2013-05-12 DIAGNOSIS — I10 Essential (primary) hypertension: Secondary | ICD-10-CM

## 2013-05-12 LAB — COMPLETE METABOLIC PANEL WITH GFR
ALBUMIN: 4.3 g/dL (ref 3.5–5.2)
ALK PHOS: 53 U/L (ref 39–117)
ALT: 13 U/L (ref 0–53)
AST: 15 U/L (ref 0–37)
BUN: 17 mg/dL (ref 6–23)
CALCIUM: 9.8 mg/dL (ref 8.4–10.5)
CHLORIDE: 99 meq/L (ref 96–112)
CO2: 26 mEq/L (ref 19–32)
Creat: 0.98 mg/dL (ref 0.50–1.35)
GFR, Est African American: >89 mL/min
GFR, Est Non African American: 84 mL/min
Glucose, Bld: 333 mg/dL — ABNORMAL HIGH (ref 70–99)
Potassium: 4.2 mEq/L (ref 3.5–5.3)
Sodium: 133 mEq/L — ABNORMAL LOW (ref 135–145)
Total Bilirubin: 0.3 mg/dL (ref 0.2–1.2)
Total Protein: 7 g/dL (ref 6.0–8.3)

## 2013-05-12 LAB — CBC WITH DIFFERENTIAL/PLATELET
Basophils Absolute: 0 10*3/uL (ref 0.0–0.1)
Basophils Relative: 0 % (ref 0–1)
EOS ABS: 0.1 10*3/uL (ref 0.0–0.7)
Eosinophils Relative: 2 % (ref 0–5)
HCT: 43.9 % (ref 39.0–52.0)
Hemoglobin: 14.4 g/dL (ref 13.0–17.0)
LYMPHS ABS: 2.1 10*3/uL (ref 0.7–4.0)
Lymphocytes Relative: 37 % (ref 12–46)
MCH: 26.7 pg (ref 26.0–34.0)
MCHC: 32.8 g/dL (ref 30.0–36.0)
MCV: 81.4 fL (ref 78.0–100.0)
MONO ABS: 0.4 10*3/uL (ref 0.1–1.0)
Monocytes Relative: 7 % (ref 3–12)
Neutro Abs: 3 10*3/uL (ref 1.7–7.7)
Neutrophils Relative %: 54 % (ref 43–77)
PLATELETS: 246 10*3/uL (ref 150–400)
RBC: 5.39 MIL/uL (ref 4.22–5.81)
RDW: 14.1 % (ref 11.5–15.5)
WBC: 5.6 10*3/uL (ref 4.0–10.5)

## 2013-05-12 LAB — POCT GLYCOSYLATED HEMOGLOBIN (HGB A1C): HEMOGLOBIN A1C: 10.4

## 2013-05-12 LAB — GLUCOSE, CAPILLARY: Glucose-Capillary: 326 mg/dL — ABNORMAL HIGH (ref 70–99)

## 2013-05-12 NOTE — Patient Instructions (Addendum)
Please start using your insulin on a regular basis. Also, work on taking your diabetes medications on a regular basis.  Please check your sugar every day and write it down in the log book so we can review at your next visit.  Please follow up with PCP in 2-4 weeks. We will talk about your blood work and diabetes at that visit.

## 2013-05-12 NOTE — Progress Notes (Signed)
Subjective:   Patient ID: Glenn Washington male   DOB: 06/27/1955 58 y.o.   MRN: [id number]  HPI: Glenn Washington is a 58 y.o. man with a pmhx of DM HTN HLD who comes to the clinic today for routine f.u. Please see A+P Charting for details. He has no complaints at this time.    Past Medical History  Diagnosis Date  . Diabetes mellitus   . Hypertension   . Hyperlipidemia   . Morbid obesity   . Constipation   . History of chest pain    Current Outpatient Prescriptions  Medication Sig Dispense Refill  . aspirin 81 MG tablet Take 81 mg by mouth daily.        . Blood Glucose Monitoring Suppl (TRUE TRACK BLOOD GLUCOSE) DEVI 1 each by Does not apply route 2 (two) times daily.        . cloNIDine (CATAPRES) 0.1 MG tablet Take 1 tablet (0.1 mg total) by mouth 2 (two) times daily.  60 tablet  6  . glipiZIDE (GLUCOTROL XL) 10 MG 24 hr tablet Take 2 tablets (20 mg total) by mouth daily.  180 tablet  2  . glucose blood test strip 1 each by Other route as needed. Use as instructed       . hydrochlorothiazide (HYDRODIURIL) 25 MG tablet Take 1 tablet (25 mg total) by mouth daily.  30 tablet  6  . Insulin Glargine (LANTUS SOLOSTAR) 100 UNIT/ML SOPN Inject 21 Units into the skin daily.  15 mL  0  . Insulin Pen Needle 31G X 5 MM MISC Use once daily. DX 250.00  100 each  0  . LANCETS ULTRA THIN 30G MISC 1 each by Does not apply route 2 (two) times daily.        Marland Kitchen. lisinopril (PRINIVIL,ZESTRIL) 40 MG tablet Take 2 tablets (80 mg total) by mouth daily.  60 tablet  6  . metFORMIN (GLUCOPHAGE-XR) 500 MG 24 hr tablet Take 4 tablets (2,000 mg total) by mouth daily with breakfast.  360 tablet  1  . naproxen (NAPROSYN) 500 MG tablet Take 1 tablet (500 mg total) by mouth 2 (two) times daily with a meal.  60 tablet  2  . polyethylene glycol (MIRALAX / GLYCOLAX) packet Take 17 g by mouth daily.        . simvastatin (ZOCOR) 40 MG tablet Take 1 tablet (40 mg total) by mouth every evening.  30 tablet  11  . traMADol  (ULTRAM) 50 MG tablet Take 1 tablet (50 mg total) by mouth every 8 (eight) hours as needed for pain.  20 tablet  0   No current facility-administered medications for this visit.   Family History  Problem Relation Age of Onset  . Prostate cancer Neg Hx   . Diabetes Father   . Diabetes Maternal Grandmother    History   Social History  . Marital Status: Single    Spouse Name: N/A    Number of Children: N/A  . Years of Education: 11   Occupational History  .    Marland Kitchen.  Unemployed   Social History Main Topics  . Smoking status: Former Games developermoker  . Smokeless tobacco: None  . Alcohol Use: 9.0 oz/week    15 Cans of beer per week     Comment: Drinks a pt of [employer] q month  . Drug Use: No     Comment: uses marijuana occasionally.  Past cocaine use but currently clean  . Sexual Activity: None  Other Topics Concern  . None   Social History Narrative   Single, 1 daughter and 4 grandsons in good health.  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   Review of Systems: Review of Systems  Constitutional: Negative for fever, chills and diaphoresis.  HENT: Negative for sore throat.   Eyes: Negative for blurred vision and double vision.  Respiratory: Negative for cough, sputum production and shortness of breath.   Cardiovascular: Negative for chest pain, palpitations, orthopnea and leg swelling.  Gastrointestinal: Positive for heartburn. Negative for nausea, vomiting, diarrhea and constipation.  Genitourinary: Negative for dysuria, urgency and frequency.  Neurological: Negative for weakness and headaches.    Objective:  Physical Exam: Filed Vitals:   05/12/13 1431  BP: 147/100  Pulse: 101  Temp: 98.1 F (36.7 C)  TempSrc: Oral  Height: 6' (1.829 m)  Weight: 278 lb 9.6 oz (126.372 kg)  SpO2: 97%   Physical Exam  Constitutional: He is oriented to person, place, and time. He appears well-developed and well-nourished.  Obese  HENT:  Head: Normocephalic and atraumatic.    Cardiovascular: Normal rate, regular rhythm, normal heart sounds and intact distal pulses.  Exam reveals no friction rub.   No murmur heard. Pulmonary/Chest: Effort normal and breath sounds normal. No respiratory distress. He has no wheezes. He has no rales.  Abdominal: Soft. Bowel sounds are normal. He exhibits no distension. There is no tenderness.  Neurological: He is alert and oriented to person, place, and time.  Psychiatric: He has a normal mood and affect. His behavior is normal.     Assessment & Plan:

## 2013-05-13 ENCOUNTER — Encounter: Payer: Self-pay | Admitting: Gastroenterology

## 2013-05-13 NOTE — Assessment & Plan Note (Addendum)
Referred for colonoscopy

## 2013-05-13 NOTE — Assessment & Plan Note (Signed)
Lab Results  Component Value Date   HGBA1C 10.4 05/12/2013   HGBA1C 11.7 10/09/2012   HGBA1C 11.8 05/26/2012     Assessment: Diabetes control: poor control (HgbA1C >9%) Progress toward A1C goal:  improved Comments: Patient reports poor compliance with metformin and insulin. He states that he only intermittently takes these medications. We discussed at length the benefits and drawbacks of taking the meds. We discussed different strategies to help him remember the meds as well as to avoid intruding on his lifestyle. We discussed the negative consequences of uncontrolled DM. The patient voiced concern about side effects of these medications. I explained the side effects of insulin and metformin. He states that does not have any of the SE when he takes the meds.   Plan: Medications:  continue current medications Home glucose monitoring: Frequency: once a day Timing: before breakfast Instruction/counseling given: reminded to bring blood glucose meter & log to each visit, reminded to bring medications to each visit and discussed the need for weight loss Educational resources provided: brochure Self management tools provided: home glucose logbook Other plans: Plan for patient to take metformin and insulin medications in the morning with breakfast. Will see patient back in 2-4 weeks for a DM recheck to ensure compliance.

## 2013-05-13 NOTE — Assessment & Plan Note (Signed)
BP Readings from Last 3 Encounters:  05/12/13 147/100  12/09/12 138/92  10/24/12 146/101    Lab Results  Component Value Date   NA 133* 05/12/2013   K 4.2 05/12/2013   CREATININE 0.98 05/12/2013    Assessment: Blood pressure control: mildly elevated Progress toward BP goal:  improved Comments: Appears to be close to goal. Encouraged patient to lose weight and be compliant with meds.   Plan: Medications:  continue current medications Educational resources provided: brochure Other plans: Focused more on DM during this visit. Will focus on HTN at next visit if DM control is improved.

## 2013-05-14 NOTE — Progress Notes (Signed)
Case discussed with Dr. Komanski at the time of the visit.  We reviewed the resident's history and exam and pertinent patient test results.  I agree with the assessment, diagnosis and plan of care documented in the resident's note. 

## 2013-05-20 ENCOUNTER — Other Ambulatory Visit: Payer: Self-pay | Admitting: *Deleted

## 2013-05-20 DIAGNOSIS — E119 Type 2 diabetes mellitus without complications: Secondary | ICD-10-CM

## 2013-05-21 MED ORDER — INSULIN GLARGINE 100 UNIT/ML SOLOSTAR PEN: 21.0000 [IU] | PEN_INJECTOR | Freq: Every day | SUBCUTANEOUS | Status: DC

## 2013-05-21 NOTE — Telephone Encounter (Signed)
Called to pharm 

## 2013-06-02 ENCOUNTER — Encounter: Payer: No Typology Code available for payment source | Admitting: Internal Medicine

## 2013-06-08 ENCOUNTER — Encounter: Payer: Self-pay | Admitting: Internal Medicine

## 2013-06-08 ENCOUNTER — Ambulatory Visit: Payer: No Typology Code available for payment source | Admitting: Internal Medicine

## 2013-06-09 ENCOUNTER — Ambulatory Visit: Payer: No Typology Code available for payment source | Admitting: Internal Medicine

## 2013-06-16 ENCOUNTER — Encounter: Payer: Self-pay | Admitting: Internal Medicine

## 2013-06-16 ENCOUNTER — Ambulatory Visit (INDEPENDENT_AMBULATORY_CARE_PROVIDER_SITE_OTHER): Payer: No Typology Code available for payment source | Admitting: Internal Medicine

## 2013-06-16 VITALS — BP 159/100 | HR 103 | Temp 98.2°F | Wt 279.9 lb

## 2013-06-16 DIAGNOSIS — M25561 Pain in right knee: Secondary | ICD-10-CM

## 2013-06-16 DIAGNOSIS — E119 Type 2 diabetes mellitus without complications: Secondary | ICD-10-CM

## 2013-06-16 DIAGNOSIS — I10 Essential (primary) hypertension: Secondary | ICD-10-CM

## 2013-06-16 DIAGNOSIS — M25562 Pain in left knee: Secondary | ICD-10-CM

## 2013-06-16 MED ORDER — INSULIN GLARGINE 100 UNIT/ML SOLOSTAR PEN: 24.0000 [IU] | PEN_INJECTOR | Freq: Every day | SUBCUTANEOUS | Status: DC

## 2013-06-16 MED ORDER — NIFEDIPINE ER OSMOTIC RELEASE 60 MG PO TB24: 60.0000 mg | ORAL_TABLET | Freq: Every day | ORAL | Status: DC

## 2013-06-16 MED ORDER — AMLODIPINE BESYLATE 5 MG PO TABS: 5.0000 mg | ORAL_TABLET | Freq: Every day | ORAL | Status: DC

## 2013-06-16 NOTE — Assessment & Plan Note (Signed)
Lab Results  Component Value Date   HGBA1C 10.4 05/12/2013   HGBA1C 11.7 10/09/2012   HGBA1C 11.8 05/26/2012     Assessment: Diabetes control: poor control (HgbA1C >9%) Progress toward A1C goal:  improved Comments: Patient reports compliance with insulin and metformin. However, he continues to have CBG in the 200's.   Plan: Medications:  Plan to increase Lantus to 24 U daily. Home glucose monitoring: Frequency: once a day Timing: before breakfast Instruction/counseling given: reminded to bring blood glucose meter & log to each visit and reminded to bring medications to each visit Educational resources provided: brochure Self management tools provided: copy of home glucose meter download Other plans: Check meter at next visit and make appropriate adjustments 3-4 weeks.

## 2013-06-16 NOTE — Addendum Note (Signed)
Addended by: Maura CrandallGOLDSTON, DARLENE C on: 06/16/2013 04:52 PM   Modules accepted: Orders

## 2013-06-16 NOTE — Patient Instructions (Addendum)
Pleas increased your lantus to 24 U daily.  Please stop taking clonidine.  Please start taking nifedipine for your blood pressure.  Please follow up in 3-4 weeks for Blood pressure check.

## 2013-06-16 NOTE — Progress Notes (Signed)
Patient ID: Glenn Washington, male   DOB: 06/13/1955, 58 y.o.   MRN: [id number]    Subjective:   Patient ID: Glenn AlconRoger Washington male   DOB: 09/14/1955 58 y.o.   MRN: [id number]  HPI: [name] is a 58 y.o. man who comes to the clinic for f/u regarding his DM and HTN.  The patient reports that he stopped taking his clonidine about 8 days ago. He has not noticed any symptoms since stopping this medication.   He reprots that he has been compliant with 22 U of insulin daily and 1500 mg of metformin. He did not bring his glucometer today, but he was able to  Report that most of his numbers have bene in the 200's which is an improvement.      Past Medical History  Diagnosis Date  . Diabetes mellitus   . Hypertension   . Hyperlipidemia   . Morbid obesity   . Constipation   . History of chest pain    Current Outpatient Prescriptions  Medication Sig Dispense Refill  . aspirin 81 MG tablet Take 81 mg by mouth daily.        . Blood Glucose Monitoring Suppl (TRUE TRACK BLOOD GLUCOSE) DEVI 1 each by Does not apply route 2 (two) times daily.        Marland Kitchen. glipiZIDE (GLUCOTROL XL) 10 MG 24 hr tablet Take 2 tablets (20 mg total) by mouth daily.  180 tablet  2  . glucose blood test strip 1 each by Other route as needed. Use as instructed       . hydrochlorothiazide (HYDRODIURIL) 25 MG tablet Take 1 tablet (25 mg total) by mouth daily.  30 tablet  6  . Insulin Glargine (LANTUS SOLOSTAR) 100 UNIT/ML Solostar Pen Inject 21 Units into the skin daily.  45 mL  4  . Insulin Pen Needle 31G X 5 MM MISC Use once daily. DX 250.00  100 each  0  . LANCETS ULTRA THIN 30G MISC 1 each by Does not apply route 2 (two) times daily.        Marland Kitchen. lisinopril (PRINIVIL,ZESTRIL) 40 MG tablet Take 2 tablets (80 mg total) by mouth daily.  60 tablet  6  . metFORMIN (GLUCOPHAGE-XR) 500 MG 24 hr tablet Take 4 tablets (2,000 mg total) by mouth daily with breakfast.  360 tablet  1  . naproxen (NAPROSYN) 500 MG tablet Take 1 tablet (500 mg  total) by mouth 2 (two) times daily with a meal.  60 tablet  2  . polyethylene glycol (MIRALAX / GLYCOLAX) packet Take 17 g by mouth daily.        . simvastatin (ZOCOR) 40 MG tablet Take 1 tablet (40 mg total) by mouth every evening.  30 tablet  11  . cloNIDine (CATAPRES) 0.1 MG tablet Take 1 tablet (0.1 mg total) by mouth 2 (two) times daily.  60 tablet  6  . traMADol (ULTRAM) 50 MG tablet Take 1 tablet (50 mg total) by mouth every 8 (eight) hours as needed for pain.  20 tablet  0   No current facility-administered medications for this visit.   Family History  Problem Relation Age of Onset  . Prostate cancer Neg Hx   . Diabetes Father   . Diabetes Maternal Grandmother    History   Social History  . Marital Status: Single    Spouse Name: N/A    Number of Children: N/A  . Years of Education: 11   Occupational History  .    .Marland Kitchen  Unemployed   Social History Main Topics  . Smoking status: Former Games developermoker  . Smokeless tobacco: None  . Alcohol Use: 9.0 oz/week    15 Cans of beer per week     Comment: Drinks a pt of [employer] q month  . Drug Use: No     Comment: uses marijuana occasionally.  Past cocaine use but currently clean  . Sexual Activity: None   Other Topics Concern  . None   Social History Narrative   Single, 1 daughter and 4 grandsons in good health.  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   Review of Systems: Review of Systems  Constitutional: Negative for fever, chills and malaise/fatigue.  HENT: Negative for hearing loss, sore throat and tinnitus.   Eyes: Negative for blurred vision, double vision and photophobia.  Respiratory: Negative for cough, shortness of breath and wheezing.   Cardiovascular: Negative for chest pain, palpitations and orthopnea.  Gastrointestinal: Negative for heartburn, nausea and vomiting.  Genitourinary: Negative for dysuria, urgency, frequency and flank pain.  Neurological: Negative for weakness and headaches.     Objective:  Physical Exam: Filed Vitals:   06/16/13 1421 06/16/13 1443  BP: 182/117 159/100  Pulse: 100 103  Temp: 98.2 F (36.8 C)   TempSrc: Oral   Weight: 279 lb 14.4 oz (126.962 kg)   SpO2: 98%    Physical Exam  Constitutional: He is oriented to person, place, and time. He appears well-developed and well-nourished. No distress.  HENT:  Head: Normocephalic and atraumatic.  Mouth/Throat: Oropharynx is clear and moist. No oropharyngeal exudate.  Cardiovascular: Normal rate, regular rhythm, normal heart sounds and intact distal pulses.  Exam reveals no friction rub.   No murmur heard. Pulmonary/Chest: Effort normal and breath sounds normal. No respiratory distress. He has no wheezes. He has no rales.  Neurological: He is alert and oriented to person, place, and time.  Skin: He is not diaphoretic.  Psychiatric: He has a normal mood and affect. His behavior is normal.     Assessment & Plan:

## 2013-06-16 NOTE — Assessment & Plan Note (Signed)
BP Readings from Last 3 Encounters:  06/16/13 159/100  05/12/13 147/100  12/09/12 138/92    Lab Results  Component Value Date   NA 133* 05/12/2013   K 4.2 05/12/2013   CREATININE 0.98 05/12/2013    Assessment: Blood pressure control: moderately elevated Progress toward BP goal:  deteriorated Comments: The patient is not a good candidate for clonidine given his medication non-compliance.  Plan: Medications:  D/C clonidine. Started nifedipine ER 60 mg (as it is available for free via [facility]). Educational resources provided: brochure Self management tools provided: home blood pressure logbook Other plans: I instructed the patient to create a BP log and return to clinic for adjustment of his BP meds in 3-4 weeks.

## 2013-06-16 NOTE — Assessment & Plan Note (Signed)
Appears to have resolved with home physical therapy as directed by patient.

## 2013-06-17 ENCOUNTER — Ambulatory Visit (AMBULATORY_SURGERY_CENTER): Payer: No Typology Code available for payment source | Admitting: *Deleted

## 2013-06-17 ENCOUNTER — Telehealth: Payer: Self-pay | Admitting: *Deleted

## 2013-06-17 VITALS — Ht 73.0 in | Wt 279.4 lb

## 2013-06-17 DIAGNOSIS — Z1211 Encounter for screening for malignant neoplasm of colon: Secondary | ICD-10-CM

## 2013-06-17 MED ORDER — MOVIPREP 100 G PO SOLR: ORAL | Status: DC

## 2013-06-17 NOTE — Telephone Encounter (Signed)
Noted. Left note for admitting staff to check for his prep results and to follow your recommendations.

## 2013-06-17 NOTE — Progress Notes (Signed)
INTERNAL MEDICINE TEACHING ATTENDING ADDENDUM - Kaiser Belluomini, MD: I reviewed and discussed at the time of visit with the resident Dr. Komanski, the patient's medical history, physical examination, diagnosis and results of tests and treatment and I agree with the patient's care as documented. 

## 2013-06-17 NOTE — Telephone Encounter (Signed)
Begin clear liquids the day prior to the procedure.  Check carefully with the patient on the day of procedure whether he is having clear output.  If not he should have an enema

## 2013-06-17 NOTE — Progress Notes (Signed)
Patient denies any allergies to eggs or soy. Patient denies any problems with anesthesia. No oxygen use at home per patient. No wt.loss/diet pills.

## 2013-06-17 NOTE — Telephone Encounter (Signed)
Patient here for pre-visit, screening colonoscopy on 06/30/13 at 11:30 am. Last colonoscopy was 2010, had poor prep using MiraLax prep, so recall for 5 years given. After asking patient, he states he did drink the entire MiraLax solution But did NOT take any dulcolax pills with prep. Patient does have history of constipation but no current or resent problems. MoviPrep given to patient per protocol. Would you like any changes to prep instructions for colonoscopy? Thanks,Kara Melching,PV

## 2013-06-30 ENCOUNTER — Ambulatory Visit (AMBULATORY_SURGERY_CENTER): Payer: No Typology Code available for payment source | Admitting: Gastroenterology

## 2013-06-30 ENCOUNTER — Encounter: Payer: Self-pay | Admitting: Gastroenterology

## 2013-06-30 VITALS — BP 112/41 | HR 85 | Temp 96.1°F | Resp 19 | Ht 73.0 in | Wt 279.0 lb

## 2013-06-30 DIAGNOSIS — Z1211 Encounter for screening for malignant neoplasm of colon: Secondary | ICD-10-CM

## 2013-06-30 LAB — GLUCOSE, CAPILLARY
GLUCOSE-CAPILLARY: 283 mg/dL — AB (ref 70–99)
Glucose-Capillary: 265 mg/dL — ABNORMAL HIGH (ref 70–99)

## 2013-06-30 MED ORDER — SODIUM CHLORIDE 0.9 % IV SOLN: 500.0000 mL | INTRAVENOUS | Status: DC

## 2013-06-30 NOTE — Patient Instructions (Signed)
Discharge instructions given with verbal understanding. Normal exam. Resume previous medications. YOU HAD AN ENDOSCOPIC PROCEDURE TODAY AT THE [facility]: Refer to the procedure report that was given to you for any specific questions about what was found during the examination.  If the procedure report does not answer your questions, please call your gastroenterologist to clarify.  If you requested that your care partner not be given the details of your procedure findings, then the procedure report has been included in a sealed envelope for you to review at your convenience later.  YOU SHOULD EXPECT: Some feelings of bloating in the abdomen. Passage of more gas than usual.  Walking can help get rid of the air that was put into your GI tract during the procedure and reduce the bloating. If you had a lower endoscopy (such as a colonoscopy or flexible sigmoidoscopy) you may notice spotting of blood in your stool or on the toilet paper. If you underwent a bowel prep for your procedure, then you may not have a normal bowel movement for a few days.  DIET: Your first meal following the procedure should be a light meal and then it is ok to progress to your normal diet.  A half-sandwich or bowl of soup is an example of a good first meal.  Heavy or fried foods are harder to digest and may make you feel nauseous or bloated.  Likewise meals heavy in dairy and vegetables can cause extra gas to form and this can also increase the bloating.  Drink plenty of fluids but you should avoid alcoholic beverages for 24 hours.  ACTIVITY: Your care partner should take you home directly after the procedure.  You should plan to take it easy, moving slowly for the rest of the day.  You can resume normal activity the day after the procedure however you should NOT DRIVE or use heavy machinery for 24 hours (because of the sedation medicines used during the test).    SYMPTOMS TO REPORT IMMEDIATELY: A gastroenterologist  can be reached at any hour.  During normal business hours, 8:30 AM to 5:00 PM Monday through Friday, call [phone].  After hours and on weekends, please call the GI answering service at [phone] who will take a message and have the physician on call contact you.   Following lower endoscopy (colonoscopy or flexible sigmoidoscopy):  Excessive amounts of blood in the stool  Significant tenderness or worsening of abdominal pains  Swelling of the abdomen that is new, acute  Fever of 100F or higher  FOLLOW UP: If any biopsies were taken you will be contacted by phone or by letter within the next 1-3 weeks.  Call your gastroenterologist if you have not heard about the biopsies in 3 weeks.  Our staff will call the home number listed on your records the next business day following your procedure to check on you and address any questions or concerns that you may have at that time regarding the information given to you following your procedure. This is a courtesy call and so if there is no answer at the home number and we have not heard from you through the emergency physician on call, we will assume that you have returned to your regular daily activities without incident.  SIGNATURES/CONFIDENTIALITY: You and/or your care partner have signed paperwork which will be entered into your electronic medical record.  These signatures attest to the fact that that the information above on your After Visit Summary has been reviewed   and is understood.  Full responsibility of the confidentiality of this discharge information lies with you and/or your care-partner. 

## 2013-06-30 NOTE — Op Note (Signed)
 [facility] [address] [place], [id number]   COLONOSCOPY PROCEDURE REPORT  PATIENT: Glenn AlconMccain, [name]  MR#: [id number] BIRTHDATE: [birth year] , 57  yrs. old GENDER: Male ENDOSCOPIST: Louis Meckelobert D Kaplan, MD REFERRED BY: PROCEDURE DATE:  06/30/2013 PROCEDURE:   Colonoscopy, diagnostic First Screening Colonoscopy - Avg.  risk and is 50 yrs.  old or older - No.  Prior Negative Screening - Now for repeat screening. Other: See Comments  History of Adenoma - Now for follow-up colonoscopy & has been > or = to 3 yrs.  N/A  Polyps Removed Today? No.  Recommend repeat exam, <10 yrs? No. ASA CLASS:   Class II INDICATIONS:Average risk patient for colon cancer. prior screening exam 2010  inadequate because of poor prep MEDICATIONS: MAC sedation, administered by CRNA and propofol (Diprivan) 350mg  IV  DESCRIPTION OF PROCEDURE:   After the risks benefits and alternatives of the procedure were thoroughly explained, informed consent was obtained.  A digital rectal exam revealed no abnormalities of the rectum.   The LB [id number]  endoscope was introduced through the anus and advanced to the cecum, which was identified by both the appendix and ileocecal valve. No adverse events experienced.   The quality of the prep was Suprep adequate The instrument was then slowly withdrawn as the colon was fully examined.      COLON FINDINGS: A normal appearing cecum, ileocecal valve, and appendiceal orifice were identified.  The ascending, hepatic flexure, transverse, splenic flexure, descending, sigmoid colon and rectum appeared unremarkable.  No polyps or cancers were seen. Retroflexed views revealed no abnormalities. The time to cecum=8 minutes 28 seconds.  Withdrawal time=7 minutes 11 seconds.  The scope was withdrawn and the procedure completed. COMPLICATIONS: There were no complications.  ENDOSCOPIC IMPRESSION: Normal colon  RECOMMENDATIONS: Continue current colorectal  screening recommendations for "routine risk" patients with a repeat colonoscopy in 10 years.   eSigned:  Louis Meckelobert D Kaplan, MD 06/30/2013 11:27 AM   cc: Angelina Sheriffhris Komanski, MD

## 2013-06-30 NOTE — Progress Notes (Signed)
  [facility] Anesthesia Post-op Note  Patient: Glenn AlconRoger Goldring  Procedure(s) Performed: colonoscopy  Patient Location: [facility] - Recovery Area  Anesthesia Type: Deep Sedation/Propofol  Level of Consciousness: awake, oriented and patient cooperative  Airway and Oxygen Therapy: Patient Spontanous Breathing  Post-op Pain: none  Post-op Assessment:  Post-op Vital signs reviewed, Patient's Cardiovascular Status Stable, Respiratory Function Stable, Patent Airway, No signs of Nausea or vomiting and Pain level controlled  Post-op Vital Signs: Reviewed and stable  Complications: No apparent anesthesia complications  Charli Halle E Markala Sitts 11:29 AM

## 2013-07-01 ENCOUNTER — Telehealth: Payer: Self-pay | Admitting: *Deleted

## 2013-07-01 NOTE — Telephone Encounter (Signed)
  Follow up Call-  Call back number 06/30/2013  Post procedure Call Back phone  # [phone]  Permission to leave phone message Yes     Patient questions:  Do you have a fever, pain , or abdominal swelling? no Pain Score  0 *  Have you tolerated food without any problems? yes  Have you been able to return to your normal activities? no  Do you have any questions about your discharge instructions: Diet   no Medications  no Follow up visit  no  Do you have questions or concerns about your Care? no  Actions: * If pain score is 4 or above: No action needed, pain <4.

## 2013-07-07 ENCOUNTER — Encounter: Payer: Self-pay | Admitting: Internal Medicine

## 2013-07-07 ENCOUNTER — Ambulatory Visit (INDEPENDENT_AMBULATORY_CARE_PROVIDER_SITE_OTHER): Payer: No Typology Code available for payment source | Admitting: Internal Medicine

## 2013-07-07 VITALS — BP 131/81 | HR 99 | Temp 97.7°F | Ht 72.0 in | Wt 283.1 lb

## 2013-07-07 DIAGNOSIS — E119 Type 2 diabetes mellitus without complications: Secondary | ICD-10-CM

## 2013-07-07 DIAGNOSIS — I10 Essential (primary) hypertension: Secondary | ICD-10-CM

## 2013-07-07 LAB — GLUCOSE, CAPILLARY: Glucose-Capillary: 240 mg/dL — ABNORMAL HIGH (ref 70–99)

## 2013-07-07 MED ORDER — INSULIN GLARGINE 100 UNIT/ML SOLOSTAR PEN: 14.0000 [IU] | PEN_INJECTOR | Freq: Two times a day (BID) | SUBCUTANEOUS | Status: DC

## 2013-07-07 MED ORDER — METFORMIN HCL ER 500 MG PO TB24: 2000.0000 mg | ORAL_TABLET | Freq: Every day | ORAL | Status: DC

## 2013-07-07 MED ORDER — HYDROCHLOROTHIAZIDE 25 MG PO TABS: 25.0000 mg | ORAL_TABLET | Freq: Every day | ORAL | Status: DC

## 2013-07-07 MED ORDER — SIMVASTATIN 40 MG PO TABS: 40.0000 mg | ORAL_TABLET | Freq: Every evening | ORAL | Status: DC

## 2013-07-07 MED ORDER — NIFEDIPINE ER OSMOTIC RELEASE 60 MG PO TB24: 60.0000 mg | ORAL_TABLET | Freq: Every day | ORAL | Status: DC

## 2013-07-07 MED ORDER — INSULIN PEN NEEDLE 31G X 5 MM MISC: Status: DC

## 2013-07-07 MED ORDER — LISINOPRIL 40 MG PO TABS: 80.0000 mg | ORAL_TABLET | Freq: Every day | ORAL | Status: DC

## 2013-07-07 MED ORDER — GLIPIZIDE ER 10 MG PO TB24: 20.0000 mg | ORAL_TABLET | Freq: Every day | ORAL | Status: DC

## 2013-07-07 NOTE — Progress Notes (Signed)
HPI The patient is a 58 y.o. male with a history of DM, HTN, presenting for a follow-up visit for HTN.  At the patient's last visit, BP was 182/117.  Clonidine was stopped at that time (non-compliance), and Nifedipine was started.  The patient notes compliance with medications, and the patient's BP is well-controlled today.  He requests refills of all of his medications.  The patient has a history of DM.  He notes that his blood sugars have improved, from the 300's to the 200's, since increasing his Lantus to 24 units (from 22) daily.  CBG is 240 this morning.  The patient notes fairly regular morning hyperglycemia, compared to the rest of the day.  He wonders if his Lantus, which he takes once daily in the morning, is wearing off overnight.  He notes no blurry vision, polyuria, polydipsia.  He notes no episodes of hypoglycemia, and no home blood sugars under 200.  ROS: General: no fevers, chills, changes in weight, changes in appetite Skin: no rash HEENT: no blurry vision, hearing changes, sore throat Pulm: no dyspnea, coughing, wheezing CV: no chest pain, palpitations, shortness of breath Abd: no abdominal pain, nausea/vomiting, diarrhea/constipation GU: no dysuria, hematuria, polyuria Ext: no arthralgias, myalgias Neuro: no weakness, numbness, or tingling  Filed Vitals:   07/07/13 1015  BP: 131/81  Pulse: 99  Temp: 97.7 F (36.5 C)    PEX General: alert, cooperative, and in no apparent distress HEENT: pupils equal round and reactive to light, vision grossly intact, oropharynx clear and non-erythematous  Neck: supple Lungs: clear to ascultation bilaterally, normal work of respiration, no wheezes, rales, ronchi Heart: regular rate and rhythm, no murmurs, gallops, or rubs Abdomen: soft, non-tender, non-distended, normal bowel sounds Extremities: no cyanosis, clubbing, or edema Neurologic: alert & oriented X3, cranial nerves II-XII intact, strength grossly intact, sensation intact to  light touch  Current Outpatient Prescriptions on File Prior to Visit  Medication Sig Dispense Refill  . aspirin 81 MG tablet Take 81 mg by mouth daily.        . Blood Glucose Monitoring Suppl (TRUE TRACK BLOOD GLUCOSE) DEVI 1 each by Does not apply route 2 (two) times daily.        Marland Kitchen. glipiZIDE (GLUCOTROL XL) 10 MG 24 hr tablet Take 2 tablets (20 mg total) by mouth daily.  180 tablet  2  . glucose blood test strip 1 each by Other route as needed. Use as instructed       . hydrochlorothiazide (HYDRODIURIL) 25 MG tablet Take 1 tablet (25 mg total) by mouth daily.  30 tablet  6  . Insulin Glargine (LANTUS SOLOSTAR) 100 UNIT/ML Solostar Pen Inject 24 Units into the skin daily.  45 mL  4  . Insulin Pen Needle 31G X 5 MM MISC Use once daily. DX 250.00  100 each  0  . LANCETS ULTRA THIN 30G MISC 1 each by Does not apply route 2 (two) times daily.        Marland Kitchen. lisinopril (PRINIVIL,ZESTRIL) 40 MG tablet Take 2 tablets (80 mg total) by mouth daily.  60 tablet  6  . metFORMIN (GLUCOPHAGE-XR) 500 MG 24 hr tablet Take 4 tablets (2,000 mg total) by mouth daily with breakfast.  360 tablet  1  . naproxen (NAPROSYN) 500 MG tablet Take 1 tablet (500 mg total) by mouth 2 (two) times daily with a meal.  60 tablet  2  . NIFEdipine (PROCARDIA XL) 60 MG 24 hr tablet Take 1 tablet (60 mg total)  by mouth daily.  30 tablet  11  . polyethylene glycol (MIRALAX / GLYCOLAX) packet Take 17 g by mouth daily.        . simvastatin (ZOCOR) 40 MG tablet Take 1 tablet (40 mg total) by mouth every evening.  30 tablet  11  . traMADol (ULTRAM) 50 MG tablet Take 1 tablet (50 mg total) by mouth every 8 (eight) hours as needed for pain.  20 tablet  0   No current facility-administered medications on file prior to visit.    Assessment/Plan

## 2013-07-07 NOTE — Patient Instructions (Signed)
General Instructions: For your Diabetes -we are increasing your Lantus insulin to 14 units twice per day  Your blood pressure is well-controlled today, good job!  Continue your Nifedipine, HCTZ, and lisinopril.  Please return for a follow-up visit for Diabetes in 3 months.  Please bring your medicines with you each time you come to clinic.  Medicines may include prescription medications, over-the-counter medications, herbal remedies, eye drops, vitamins, or other pills.   Progress Toward Treatment Goals:  Treatment Goal 07/07/2013  Hemoglobin A1C improved  Blood pressure at goal    Self Care Goals & Plans:  Self Care Goal 07/07/2013  Manage my medications take my medicines as prescribed; bring my medications to every visit; refill my medications on time  Monitor my health -  Eat healthy foods drink diet soda or water instead of juice or soda; eat more vegetables; eat foods that are low in salt; eat baked foods instead of fried foods; eat fruit for snacks and desserts  Be physically active -    Home Blood Glucose Monitoring 06/16/2013  Check my blood sugar once a day  When to check my blood sugar before breakfast     Care Management & Community Referrals:  Referral 07/07/2013  Referrals made for care management support none needed  Referrals made to community resources -

## 2013-07-07 NOTE — Assessment & Plan Note (Signed)
BP Readings from Last 3 Encounters:  07/07/13 131/81  06/30/13 112/41  06/16/13 159/100    Lab Results  Component Value Date   NA 133* 05/12/2013   K 4.2 05/12/2013   CREATININE 0.98 05/12/2013    Assessment: Blood pressure control: controlled Progress toward BP goal:  at goal Comments: BP well-controlled today  Plan: Medications:  Continue HCTZ 25, Nifedipine XL 60, lisinopril 80 (which the patient has been on for an extended period of time. Educational resources provided: brochure Self management tools provided:   Other plans: Recheck at next visit

## 2013-07-07 NOTE — Assessment & Plan Note (Addendum)
Lab Results  Component Value Date   HGBA1C 10.4 05/12/2013   HGBA1C 11.7 10/09/2012   HGBA1C 11.8 05/26/2012     Assessment: Diabetes control: fair control Progress toward A1C goal:  improved Comments: The patient notes improvement in blood sugars, but still significantly above goal.  Lantus may be wearing off before 24 hrs in this patient.  We will increase total insulin, and split doses.  Plan: Medications:  Change Lantus to 14 units BID (from 24 units daily) Home glucose monitoring: Frequency:   Timing:   Instruction/counseling given: reminded to bring blood glucose meter & log to each visit Educational resources provided: brochure Self management tools provided:   Other plans: recheck at next visit

## 2013-07-08 NOTE — Progress Notes (Signed)
Case discussed with Dr. Brown soon after the resident saw the patient.  We reviewed the resident's history and exam and pertinent patient test results.  I agree with the assessment, diagnosis, and plan of care documented in the resident's note. 

## 2013-08-27 ENCOUNTER — Ambulatory Visit: Payer: Self-pay

## 2013-09-02 ENCOUNTER — Ambulatory Visit: Payer: No Typology Code available for payment source

## 2013-09-04 ENCOUNTER — Other Ambulatory Visit: Payer: Self-pay | Admitting: *Deleted

## 2013-09-04 NOTE — Telephone Encounter (Signed)
Refill done, pt gave old Rx #

## 2013-10-07 ENCOUNTER — Encounter: Payer: Self-pay | Admitting: Gastroenterology

## 2013-11-27 ENCOUNTER — Encounter: Payer: Self-pay | Admitting: *Deleted

## 2014-01-11 ENCOUNTER — Ambulatory Visit: Payer: No Typology Code available for payment source | Admitting: Pulmonary Disease

## 2014-01-14 ENCOUNTER — Encounter: Payer: Self-pay | Admitting: Pulmonary Disease

## 2014-01-14 ENCOUNTER — Ambulatory Visit (INDEPENDENT_AMBULATORY_CARE_PROVIDER_SITE_OTHER): Payer: Self-pay | Admitting: Pulmonary Disease

## 2014-01-14 ENCOUNTER — Ambulatory Visit (INDEPENDENT_AMBULATORY_CARE_PROVIDER_SITE_OTHER): Payer: Self-pay | Admitting: *Deleted

## 2014-01-14 VITALS — BP 155/110 | HR 96 | Temp 97.8°F | Ht 72.0 in | Wt 283.5 lb

## 2014-01-14 DIAGNOSIS — E1165 Type 2 diabetes mellitus with hyperglycemia: Secondary | ICD-10-CM

## 2014-01-14 DIAGNOSIS — Z23 Encounter for immunization: Secondary | ICD-10-CM

## 2014-01-14 DIAGNOSIS — E785 Hyperlipidemia, unspecified: Secondary | ICD-10-CM

## 2014-01-14 DIAGNOSIS — I10 Essential (primary) hypertension: Secondary | ICD-10-CM

## 2014-01-14 DIAGNOSIS — IMO0002 Reserved for concepts with insufficient information to code with codable children: Secondary | ICD-10-CM

## 2014-01-14 DIAGNOSIS — Z0000 Encounter for general adult medical examination without abnormal findings: Secondary | ICD-10-CM

## 2014-01-14 LAB — LIPID PANEL
CHOLESTEROL: 241 mg/dL — AB (ref 0–200)
HDL: 53 mg/dL (ref >39)
LDL CALC: 171 mg/dL — AB (ref 0–99)
TRIGLYCERIDES: 85 mg/dL (ref <150)
Total CHOL/HDL Ratio: 4.5 Ratio
VLDL: 17 mg/dL (ref 0–40)

## 2014-01-14 LAB — POCT GLYCOSYLATED HEMOGLOBIN (HGB A1C): Hemoglobin A1C: 11.2

## 2014-01-14 LAB — GLUCOSE, CAPILLARY: Glucose-Capillary: 185 mg/dL — ABNORMAL HIGH (ref 70–99)

## 2014-01-14 MED ORDER — INSULIN PEN NEEDLE 31G X 5 MM MISC: Status: DC

## 2014-01-14 NOTE — Progress Notes (Signed)
Subjective:    Patient ID: Glenn Washington, male    DOB: 04/29/1955, 58 y.o.   MRN: [id number]  HPI Glenn Washington is a 58 year old man with history of DM, HTN, HLD presenting for follow up.  He was last seen in clinic 07/07/2013  HTN: Last clinic note he was on HCTZ 25mg  daily, Nifedipine XL 60mg  daily, lisinopril 80mg  daily. Notes that he did not take HCTZ and Nifedipine this morning because he was running late. Reports compliance of medication. No episodes of lightheadedness, dizziness, tremulousness.   DM: Last clinic note he was changed to Lantus 14 units BID from 24 units daily. Ran out of strips 3-4 days. Runs about 200s. At highest 276. Lowest 195. Measures his BS once a day - mostly midday. No episodes of hypoglycemia. Eats a lot of beans, broccoli, rice, bread and drinks lemonade. Does get some exercise - walks.   Review of Systems  Constitutional: Negative for fever and chills.  HENT: Negative for sore throat.   Eyes: Negative for visual disturbance.  Respiratory: Negative for cough and shortness of breath.   Cardiovascular: Negative for chest pain.  Gastrointestinal: Negative for nausea, vomiting, abdominal pain and diarrhea.  Endocrine: Positive for polydipsia and polyuria.  Genitourinary: Negative for dysuria and difficulty urinating.  Musculoskeletal: Negative for arthralgias.  Skin: Negative for rash.  Neurological: Negative for dizziness, tremors, weakness, light-headedness and numbness.   Past Medical History  Diagnosis Date  . Diabetes mellitus   . Hypertension   . Hyperlipidemia   . Morbid obesity   . Constipation   . History of chest pain 2002   Current Outpatient Prescriptions on File Prior to Visit  Medication Sig Dispense Refill  . aspirin 81 MG tablet Take 81 mg by mouth daily.      . Blood Glucose Monitoring Suppl (TRUE TRACK BLOOD GLUCOSE) DEVI 1 each by Does not apply route 2 (two) times daily.      Marland Kitchen. glipiZIDE (GLUCOTROL XL) 10 MG 24 hr tablet Take 2  tablets (20 mg total) by mouth daily. 180 tablet 3  . glucose blood test strip 1 each by Other route as needed. Use as instructed     . hydrochlorothiazide (HYDRODIURIL) 25 MG tablet Take 1 tablet (25 mg total) by mouth daily. 30 tablet 11  . Insulin Glargine (LANTUS SOLOSTAR) 100 UNIT/ML Solostar Pen Inject 14 Units into the skin 2 (two) times daily. 45 mL 4  . Insulin Pen Needle 31G X 5 MM MISC Use once daily. DX 250.00 100 each 0  . LANCETS ULTRA THIN 30G MISC 1 each by Does not apply route 2 (two) times daily.      Marland Kitchen. lisinopril (PRINIVIL,ZESTRIL) 40 MG tablet Take 2 tablets (80 mg total) by mouth daily. 60 tablet 6  . metFORMIN (GLUCOPHAGE-XR) 500 MG 24 hr tablet Take 4 tablets (2,000 mg total) by mouth daily with breakfast. 360 tablet 2  . NIFEdipine (PROCARDIA XL) 60 MG 24 hr tablet Take 1 tablet (60 mg total) by mouth daily. 30 tablet 11  . polyethylene glycol (MIRALAX / GLYCOLAX) packet Take 17 g by mouth daily.      . simvastatin (ZOCOR) 40 MG tablet Take 1 tablet (40 mg total) by mouth every evening. 30 tablet 11   No current facility-administered medications on file prior to visit.   Today's Vitals   01/14/14 1023  BP: 155/110  Pulse: 96  Temp: 97.8 F (36.6 C)  TempSrc: Oral  Height: 6' (1.829 m)  Weight: 283 lb 8 oz (128.595 kg)  SpO2: 96%  PainSc: 0-No pain   Objective:  Physical Exam  Constitutional: He is oriented to person, place, and time. He appears well-developed and well-nourished. No distress.  HENT:  Head: Normocephalic and atraumatic.  Mouth/Throat: Oropharynx is clear and moist.  Eyes: EOM are normal.  Neck: Neck supple.  Cardiovascular: Normal rate and regular rhythm.   No murmur heard. Pulmonary/Chest: Effort normal and breath sounds normal. No respiratory distress. He has no wheezes.  Abdominal: Soft. He exhibits no distension. There is no tenderness.  Musculoskeletal: Normal range of motion. He exhibits no edema.  Neurological: He is alert and  oriented to person, place, and time. No cranial nerve deficit.  Skin: Skin is warm and dry.  Psychiatric: He has a normal mood and affect.   Assessment & Plan:  Please refer to problem based charting.

## 2014-01-14 NOTE — Progress Notes (Signed)
Internal Medicine Clinic Attending  I saw and evaluated the patient.  I personally confirmed the key portions of the history and exam documented by Dr. Krall and I reviewed pertinent patient test results.  The assessment, diagnosis, and plan were formulated together and I agree with the documentation in the resident's note.  

## 2014-01-14 NOTE — Assessment & Plan Note (Signed)
-  Recheck lipid panel today. -Continue simvastatin 40mg  Q[facility].

## 2014-01-14 NOTE — Assessment & Plan Note (Addendum)
Lab Results  Component Value Date   HGBA1C 11.2 01/14/2014   HGBA1C 10.4 05/12/2013   HGBA1C 11.7 10/09/2012     Assessment: Diabetes control: poor control (HgbA1C >9%) Progress toward A1C goal:  deteriorated Comments: Noted that patient decreased his dose of metformin to 3 tablets (1500mg ) daily because he thought he didn't need to take as much on insulin.  Plan: Medications:  continue current medications including going back to metformin 2000mg  daily, lantus 14u BID, glipizide 20mg  daily. Home glucose monitoring: Frequency: once a day Instruction/counseling given: reminded to bring blood glucose meter & log to each visit, reminded to bring medications to each visit and discussed diet Other plans: Referral made to diabetes education.  Follow up in 3 months - may need to further increase insulin.

## 2014-01-14 NOTE — Assessment & Plan Note (Addendum)
Influenza vaccine administered today.

## 2014-01-14 NOTE — Patient Instructions (Signed)
Please follow up in 3 months. Take metformin 4 tablets (2000mg ) daily with breakfast.  General Instructions:   Please bring your medicines with you each time you come to clinic.  Medicines may include prescription medications, over-the-counter medications, herbal remedies, eye drops, vitamins, or other pills.   Progress Toward Treatment Goals:  Treatment Goal 01/14/2014  Hemoglobin A1C unable to assess  Blood pressure at goal    Self Care Goals & Plans:  Self Care Goal 01/14/2014  Manage my medications take my medicines as prescribed; bring my medications to every visit  Monitor my health keep track of my blood glucose; bring my glucose meter and log to each visit  Eat healthy foods drink diet soda or water instead of juice or soda; eat foods that are low in salt; eat baked foods instead of fried foods; eat more vegetables; eat fruit for snacks and desserts  Be physically active find an activity I enjoy    Home Blood Glucose Monitoring 01/14/2014  Check my blood sugar once a day  When to check my blood sugar -     Care Management & Community Referrals:  Referral 01/14/2014  Referrals made for care management support diabetes educator  Referrals made to community resources -

## 2014-01-14 NOTE — Assessment & Plan Note (Signed)
BP Readings from Last 3 Encounters:  01/14/14 155/110  07/07/13 131/81  06/30/13 112/41  *Recheck BP 130/90  Lab Results  Component Value Date   NA 133* 05/12/2013   K 4.2 05/12/2013   CREATININE 0.98 05/12/2013    Assessment: Blood pressure control: controlled Progress toward BP goal:  at goal  Plan: Medications:  continue current medications including HCTZ 25mg  daily, Lisinopril 80mg  daily, Procardia XL 60mg  daily

## 2014-02-08 ENCOUNTER — Encounter: Payer: Self-pay | Admitting: Dietician

## 2014-03-11 ENCOUNTER — Ambulatory Visit: Payer: Self-pay

## 2014-06-09 ENCOUNTER — Other Ambulatory Visit: Payer: Self-pay | Admitting: *Deleted

## 2014-06-09 DIAGNOSIS — I10 Essential (primary) hypertension: Secondary | ICD-10-CM

## 2014-06-10 MED ORDER — NIFEDIPINE ER OSMOTIC RELEASE 60 MG PO TB24: 60.0000 mg | ORAL_TABLET | Freq: Every day | ORAL | Status: DC

## 2014-06-10 NOTE — Telephone Encounter (Signed)
Needs appt ASAP (30 days) for uncontrolled DM.

## 2014-06-15 NOTE — Telephone Encounter (Signed)
Called to pharm 

## 2014-07-02 ENCOUNTER — Other Ambulatory Visit: Payer: Self-pay | Admitting: *Deleted

## 2014-07-02 DIAGNOSIS — I10 Essential (primary) hypertension: Secondary | ICD-10-CM

## 2014-07-02 MED ORDER — LISINOPRIL 40 MG PO TABS: 80.0000 mg | ORAL_TABLET | Freq: Every day | ORAL | Status: DC

## 2014-07-06 NOTE — Telephone Encounter (Signed)
Called to pharm 

## 2014-07-07 ENCOUNTER — Ambulatory Visit (INDEPENDENT_AMBULATORY_CARE_PROVIDER_SITE_OTHER): Payer: Self-pay | Admitting: Pulmonary Disease

## 2014-07-07 ENCOUNTER — Encounter: Payer: Self-pay | Admitting: Pulmonary Disease

## 2014-07-07 VITALS — BP 151/97 | HR 88 | Temp 98.4°F | Ht 70.0 in | Wt 273.1 lb

## 2014-07-07 DIAGNOSIS — E785 Hyperlipidemia, unspecified: Secondary | ICD-10-CM

## 2014-07-07 DIAGNOSIS — E1165 Type 2 diabetes mellitus with hyperglycemia: Secondary | ICD-10-CM

## 2014-07-07 DIAGNOSIS — IMO0002 Reserved for concepts with insufficient information to code with codable children: Secondary | ICD-10-CM

## 2014-07-07 DIAGNOSIS — Z794 Long term (current) use of insulin: Secondary | ICD-10-CM

## 2014-07-07 DIAGNOSIS — I10 Essential (primary) hypertension: Secondary | ICD-10-CM

## 2014-07-07 LAB — GLUCOSE, CAPILLARY: GLUCOSE-CAPILLARY: 117 mg/dL — AB (ref 70–99)

## 2014-07-07 LAB — POCT GLYCOSYLATED HEMOGLOBIN (HGB A1C): Hemoglobin A1C: 8.8

## 2014-07-07 MED ORDER — ATORVASTATIN CALCIUM 40 MG PO TABS: 80.0000 mg | ORAL_TABLET | Freq: Every day | ORAL | Status: DC

## 2014-07-07 MED ORDER — LISINOPRIL 40 MG PO TABS: 80.0000 mg | ORAL_TABLET | Freq: Every day | ORAL | Status: DC

## 2014-07-07 MED ORDER — METFORMIN HCL ER 500 MG PO TB24: 2000.0000 mg | ORAL_TABLET | Freq: Every day | ORAL | Status: DC

## 2014-07-07 MED ORDER — METOPROLOL SUCCINATE ER 25 MG PO TB24: 25.0000 mg | ORAL_TABLET | Freq: Every day | ORAL | Status: DC

## 2014-07-07 NOTE — Patient Instructions (Signed)
General Instructions:   Please bring your medicines with you each time you come to clinic.  Medicines may include prescription medications, over-the-counter medications, herbal remedies, eye drops, vitamins, or other pills.   Progress Toward Treatment Goals:  Treatment Goal 07/07/2014  Hemoglobin A1C improved  Blood pressure improved    Self Care Goals & Plans:  Self Care Goal 07/07/2014  Manage my medications take my medicines as prescribed; bring my medications to every visit; refill my medications on time  Monitor my health keep track of my blood glucose; bring my glucose meter and log to each visit  Eat healthy foods eat more vegetables; eat foods that are low in salt; eat baked foods instead of fried foods  Be physically active take a walk every day    Care Management & Community Referrals:  Referral 01/14/2014  Referrals made for care management support diabetes educator  Referrals made to community resources -

## 2014-07-07 NOTE — Assessment & Plan Note (Addendum)
10 year cardiovascular risk 32.05% with diabetes. Needs high intensity statin.  Plan: -Change simvastatin 40mg  to atorvastatin 80mg  daily -Recheck lipid panel in 1-3 months.

## 2014-07-07 NOTE — Progress Notes (Signed)
Medicine attending: Medical history, presenting problems, physical findings, and medications, reviewed with Dr Jennifer Krall and I concur with her evaluation and management plan. 

## 2014-07-07 NOTE — Assessment & Plan Note (Signed)
BP Readings from Last 3 Encounters:  07/07/14 151/97  01/14/14 155/110  07/07/13 131/81    Lab Results  Component Value Date   NA 133* 05/12/2013   K 4.2 05/12/2013   CREATININE 0.98 05/12/2013    Assessment: Blood pressure control: mildly elevated Progress toward BP goal:  improved  Plan: Medications:  continue current medications including HCTZ 25mg , lisinopril 80mg , Procardia XL 60mg . Add Toprol XL 25mg  daily. Other plans:  -Follow up in 3 months -May consider decreasing lisinopril

## 2014-07-07 NOTE — Progress Notes (Signed)
Subjective:   Patient ID: Glenn Washington, male    DOB: 03/05/1955, 59 y.o.   MRN: [id number]  HPI [name] is a 59 year old man with history of DM, HTN, HLD, presenting for follow up.  He was last seen in clinic 01/14/2014.   Taking metformin 2000mg  daily, glipizide 20mg  daily, and lantus 14u BID. Highest BG 190, lowest 121. No hypoglycemic episodes.   Review of Systems Constitutional: no fevers/chills Eyes: no vision changes Ears, nose, mouth, throat, and face: no cough Respiratory: no shortness of breath Cardiovascular: no chest pain Gastrointestinal: no nausea/vomiting, no abdominal pain, no constipation, no diarrhea Genitourinary: no dysuria, no hematuria Integument: no rash Hematologic/lymphatic: no bleeding/bruising, no edema Musculoskeletal: no arthralgias, no myalgias Neurological: no paresthesias, no weakness  Past Medical History  Diagnosis Date  . Diabetes mellitus   . Hypertension   . Hyperlipidemia   . Morbid obesity   . Constipation   . History of chest pain 2002   Current Outpatient Prescriptions on File Prior to Visit  Medication Sig Dispense Refill  . aspirin 81 MG tablet Take 81 mg by mouth daily.      . Blood Glucose Monitoring Suppl (TRUE TRACK BLOOD GLUCOSE) DEVI 1 each by Does not apply route 2 (two) times daily.      Marland Kitchen. glipiZIDE (GLUCOTROL XL) 10 MG 24 hr tablet Take 2 tablets (20 mg total) by mouth daily. 180 tablet 3  . glucose blood test strip 1 each by Other route as needed. Use as instructed     . hydrochlorothiazide (HYDRODIURIL) 25 MG tablet Take 1 tablet (25 mg total) by mouth daily. 30 tablet 11  . Insulin Glargine (LANTUS SOLOSTAR) 100 UNIT/ML Solostar Pen Inject 14 Units into the skin 2 (two) times daily. (Patient taking differently: Inject 28 Units into the skin every morning. ) 45 mL 4  . Insulin Pen Needle 31G X 5 MM MISC Use once daily. 100 each 1  . LANCETS ULTRA THIN 30G MISC 1 each by Does not apply route 2 (two) times daily.       Marland Kitchen. lisinopril (PRINIVIL,ZESTRIL) 40 MG tablet Take 2 tablets (80 mg total) by mouth daily. 180 tablet 3  . metFORMIN (GLUCOPHAGE-XR) 500 MG 24 hr tablet Take 4 tablets (2,000 mg total) by mouth daily with breakfast. 360 tablet 2  . NIFEdipine (PROCARDIA XL) 60 MG 24 hr tablet Take 1 tablet (60 mg total) by mouth daily. 30 tablet 0  . polyethylene glycol (MIRALAX / GLYCOLAX) packet Take 17 g by mouth daily.      . simvastatin (ZOCOR) 40 MG tablet Take 1 tablet (40 mg total) by mouth every evening. 30 tablet 11   No current facility-administered medications on file prior to visit.   Today's Vitals   07/07/14 1030  BP: 150/92  Pulse: 91  Temp: 98.4 F (36.9 C)  TempSrc: Oral  Height: 5\' 10"  (1.778 m)  Weight: 409273 lb 1.6 oz (123.877 kg)  SpO2: 100%  PainSc: 0-No pain   Objective:  Physical Exam  Constitutional: He is oriented to person, place, and time. He appears well-developed and well-nourished. No distress.  HENT:  Head: Normocephalic and atraumatic.  Mouth/Throat: Oropharynx is clear and moist.  Eyes: EOM are normal.  Neck: Neck supple.  Cardiovascular: Normal rate and regular rhythm.   No murmur heard. Pulses:      Dorsalis pedis pulses are 1+ on the right side, and 1+ on the left side.       Posterior  tibial pulses are 1+ on the right side, and 1+ on the left side.  Pulmonary/Chest: Effort normal and breath sounds normal. No respiratory distress. He has no wheezes.  Abdominal: Soft. He exhibits no distension. There is no tenderness.  Musculoskeletal: Normal range of motion. He exhibits no edema.  Neurological: He is alert and oriented to person, place, and time. No cranial nerve deficit.  Skin: Skin is warm and dry.  Psychiatric: He has a normal mood and affect.   Assessment & Plan:  Please refer to problem based charting.

## 2014-07-07 NOTE — Assessment & Plan Note (Addendum)
Lab Results  Component Value Date   HGBA1C 8.8 07/07/2014   HGBA1C 11.2 01/14/2014   HGBA1C 10.4 05/12/2013     Assessment: Diabetes control: fair control Progress toward A1C goal:  improved  Plan: Medications:  continue current medications including glipizide 24hr tab 20mg  daily, lantus 14u BID, metformin 24hr tab 2000mg  daily Other plans:  -Follow up in 3 months -Eye exam referral placed

## 2014-07-09 ENCOUNTER — Other Ambulatory Visit: Payer: Self-pay | Admitting: *Deleted

## 2014-07-09 DIAGNOSIS — E785 Hyperlipidemia, unspecified: Secondary | ICD-10-CM

## 2014-07-09 NOTE — Telephone Encounter (Signed)
[facility] is asking to change Atorvastatin to Crestor as they can get that for free.

## 2014-07-12 ENCOUNTER — Other Ambulatory Visit: Payer: Self-pay | Admitting: *Deleted

## 2014-07-12 DIAGNOSIS — IMO0002 Reserved for concepts with insufficient information to code with codable children: Secondary | ICD-10-CM

## 2014-07-12 DIAGNOSIS — E1165 Type 2 diabetes mellitus with hyperglycemia: Secondary | ICD-10-CM

## 2014-07-12 MED ORDER — ROSUVASTATIN CALCIUM 20 MG PO TABS: 20.0000 mg | ORAL_TABLET | Freq: Every day | ORAL | Status: DC

## 2014-07-12 MED ORDER — INSULIN GLARGINE 100 UNIT/ML SOLOSTAR PEN: 14.0000 [IU] | PEN_INJECTOR | Freq: Two times a day (BID) | SUBCUTANEOUS | Status: DC

## 2014-07-12 NOTE — Telephone Encounter (Signed)
I discontinued Atorvastatin and ordered Crestor 20mg  [facility]. [facility] Thank you.

## 2014-07-12 NOTE — Telephone Encounter (Signed)
rx faxed in 

## 2014-07-12 NOTE — Telephone Encounter (Signed)
Rx faxed in.

## 2014-07-15 ENCOUNTER — Other Ambulatory Visit: Payer: Self-pay | Admitting: *Deleted

## 2014-07-15 DIAGNOSIS — I10 Essential (primary) hypertension: Secondary | ICD-10-CM

## 2014-07-15 MED ORDER — NIFEDIPINE ER OSMOTIC RELEASE 60 MG PO TB24: 60.0000 mg | ORAL_TABLET | Freq: Every day | ORAL | Status: DC

## 2014-07-15 NOTE — Telephone Encounter (Signed)
Rx faxed in.

## 2014-09-02 LAB — HM DIABETES EYE EXAM

## 2014-09-03 ENCOUNTER — Other Ambulatory Visit: Payer: Self-pay | Admitting: *Deleted

## 2014-09-03 DIAGNOSIS — I10 Essential (primary) hypertension: Secondary | ICD-10-CM

## 2014-09-03 DIAGNOSIS — E1165 Type 2 diabetes mellitus with hyperglycemia: Secondary | ICD-10-CM

## 2014-09-03 DIAGNOSIS — IMO0002 Reserved for concepts with insufficient information to code with codable children: Secondary | ICD-10-CM

## 2014-09-03 MED ORDER — HYDROCHLOROTHIAZIDE 25 MG PO TABS: 25.0000 mg | ORAL_TABLET | Freq: Every day | ORAL | Status: DC

## 2014-10-06 ENCOUNTER — Telehealth: Payer: Self-pay | Admitting: Pulmonary Disease

## 2014-10-06 NOTE — Telephone Encounter (Addendum)
Call to patient to confirm appointment for 10/07/14 at 9:30 [facility]

## 2014-10-07 ENCOUNTER — Ambulatory Visit: Payer: Self-pay

## 2014-10-08 ENCOUNTER — Other Ambulatory Visit: Payer: Self-pay | Admitting: Pulmonary Disease

## 2014-10-08 DIAGNOSIS — IMO0002 Reserved for concepts with insufficient information to code with codable children: Secondary | ICD-10-CM

## 2014-10-08 DIAGNOSIS — E1165 Type 2 diabetes mellitus with hyperglycemia: Secondary | ICD-10-CM

## 2014-10-08 NOTE — Telephone Encounter (Signed)
Pt called requesting glipizide to be filled @ [facility].

## 2014-10-12 MED ORDER — GLIPIZIDE ER 10 MG PO TB24: 20.0000 mg | ORAL_TABLET | Freq: Every day | ORAL | Status: DC

## 2014-10-13 NOTE — Telephone Encounter (Signed)
Tried to call pt, no answer 

## 2014-10-14 NOTE — Telephone Encounter (Signed)
Pt called about refill. Refill approved but not sent.  Pt goes to [facility] [address] not [facility] for this med. Called in

## 2014-10-15 ENCOUNTER — Ambulatory Visit: Payer: Self-pay

## 2014-10-19 ENCOUNTER — Other Ambulatory Visit: Payer: Self-pay | Admitting: *Deleted

## 2014-10-19 DIAGNOSIS — I10 Essential (primary) hypertension: Secondary | ICD-10-CM

## 2014-10-20 MED ORDER — METOPROLOL SUCCINATE ER 25 MG PO TB24: 25.0000 mg | ORAL_TABLET | Freq: Every day | ORAL | Status: DC

## 2014-12-01 ENCOUNTER — Other Ambulatory Visit: Payer: Self-pay | Admitting: *Deleted

## 2014-12-01 DIAGNOSIS — I10 Essential (primary) hypertension: Secondary | ICD-10-CM

## 2014-12-02 ENCOUNTER — Encounter: Payer: Self-pay | Admitting: Internal Medicine

## 2014-12-02 ENCOUNTER — Ambulatory Visit (INDEPENDENT_AMBULATORY_CARE_PROVIDER_SITE_OTHER): Payer: Self-pay | Admitting: Internal Medicine

## 2014-12-02 VITALS — BP 138/83 | HR 103 | Temp 98.2°F | Ht 70.0 in | Wt 248.0 lb

## 2014-12-02 DIAGNOSIS — IMO0001 Reserved for inherently not codable concepts without codable children: Secondary | ICD-10-CM

## 2014-12-02 DIAGNOSIS — Z23 Encounter for immunization: Secondary | ICD-10-CM

## 2014-12-02 DIAGNOSIS — I10 Essential (primary) hypertension: Secondary | ICD-10-CM

## 2014-12-02 DIAGNOSIS — R634 Abnormal weight loss: Secondary | ICD-10-CM

## 2014-12-02 DIAGNOSIS — Z0000 Encounter for general adult medical examination without abnormal findings: Secondary | ICD-10-CM

## 2014-12-02 DIAGNOSIS — Z794 Long term (current) use of insulin: Secondary | ICD-10-CM

## 2014-12-02 DIAGNOSIS — E1165 Type 2 diabetes mellitus with hyperglycemia: Secondary | ICD-10-CM

## 2014-12-02 LAB — POCT GLYCOSYLATED HEMOGLOBIN (HGB A1C): HEMOGLOBIN A1C: 7.8

## 2014-12-02 LAB — GLUCOSE, CAPILLARY: GLUCOSE-CAPILLARY: 123 mg/dL — AB (ref 65–99)

## 2014-12-02 MED ORDER — LISINOPRIL 40 MG PO TABS: 40.0000 mg | ORAL_TABLET | Freq: Every day | ORAL | Status: DC

## 2014-12-02 MED ORDER — GLIPIZIDE ER 10 MG PO TB24: 20.0000 mg | ORAL_TABLET | Freq: Every day | ORAL | Status: DC

## 2014-12-02 MED ORDER — NIFEDIPINE ER OSMOTIC RELEASE 60 MG PO TB24: 60.0000 mg | ORAL_TABLET | Freq: Every day | ORAL | Status: DC

## 2014-12-02 MED ORDER — POLYETHYLENE GLYCOL 3350 17 G PO PACK: 17.0000 g | PACK | Freq: Every day | ORAL | Status: DC

## 2014-12-02 NOTE — Patient Instructions (Signed)
Glenn Washington was nice meeting you today.  -I have reduced your Lisinopril dose to 40 mg daily.  -Please take Nexium every day as instructed for heartburn.   -You have also been referred to gastroenterology for an endoscopy. Our office will call you with an appointment date.  -Please check your blood glucose once daily at home and remember to bring your log and meter with you to the next visit.  -Ophthalmology referral has been placed today. Our office will call you with an appointment date.  -I have ordered some labs today and will call you in the results come back.  -Please return for follow-up visit in 1 month.

## 2014-12-03 ENCOUNTER — Other Ambulatory Visit: Payer: Self-pay | Admitting: Pulmonary Disease

## 2014-12-03 DIAGNOSIS — Z8619 Personal history of other infectious and parasitic diseases: Secondary | ICD-10-CM | POA: Insufficient documentation

## 2014-12-03 LAB — CBC
HEMATOCRIT: 43.5 % (ref 37.5–51.0)
HEMOGLOBIN: 13.8 g/dL (ref 12.6–17.7)
MCH: 27.3 pg (ref 26.6–33.0)
MCHC: 31.7 g/dL (ref 31.5–35.7)
MCV: 86 fL (ref 79–97)
Platelets: 376 10*3/uL (ref 150–379)
RBC: 5.06 x10E6/uL (ref 4.14–5.80)
RDW: 13.3 % (ref 12.3–15.4)
WBC: 7.1 10*3/uL (ref 3.4–10.8)

## 2014-12-03 LAB — TSH: TSH: 0.79 u[IU]/mL (ref 0.450–4.500)

## 2014-12-03 LAB — HEPATITIS C ANTIBODY (REFLEX): HCV Ab: 0.1 s/co ratio (ref 0.0–0.9)

## 2014-12-03 LAB — HIV ANTIBODY (ROUTINE TESTING W REFLEX): HIV Screen 4th Generation wRfx: NONREACTIVE

## 2014-12-03 LAB — HCV COMMENT:

## 2014-12-03 NOTE — Assessment & Plan Note (Signed)
BP Readings from Last 3 Encounters:  12/02/14 138/83  07/07/14 151/97  01/14/14 155/110    Lab Results  Component Value Date   NA 133* 05/12/2013   K 4.2 05/12/2013   CREATININE 0.98 05/12/2013    Assessment: Blood pressure control:  controlled Progress toward BP goal:   under goal (<140/90)  Plan: Medications: Lisinopril reduced from 80 mg to 40 mg daily. Continue HCTZ 25 mg daily, Procardia XL 60 mg daily, and Toprol XL 25 mg daily.  Educational resources provided:  Encouraged healthy eating and exercise.

## 2014-12-03 NOTE — Telephone Encounter (Signed)
Pt called requesting metformin to be filled. Please call pt back.

## 2014-12-03 NOTE — Assessment & Plan Note (Addendum)
Unintentional weight loss and 3-4 episodes of melena one month ago. Patient has lost 25 lbs since 06/2014 as per epic records. He has a history of heartburn and has been having diffuse abdominal pain intermittently. Denies any nausea, vomiting, diarrhea, or constipation. Denies any weakness or fatigue. Denies any fevers or night sweats. Denies having any hematemesis or hematochezia. States he smoked in the past for a total of 6 months. Colonoscopy normal in 06/2013. Labs to day showing HIV and Hep C tests negative. TSH normal. Normal Hgb (13.8). Patient's dark stools could be due to upper GI bleed in the past.  -GI referral for endoscopy -Continue daily Nexium -F/u visit in 1 month

## 2014-12-03 NOTE — Progress Notes (Signed)
Patient ID: Glenn Washington, male   DOB: 18-May-1955, 59 y.o.   MRN: [id number]   Subjective:   Patient ID: Glenn Washington male   DOB: [birth year] 59 y.o.   MRN: [id number]  HPI: [name] is a 59 y.o. M with a PMHx of conditions listed below presenting to the clinic today for a follow up of his DM and HTN. Patient also reports having unintentional weight loss and 3-4 episodes of melena one month ago. States he has a history of heartburn and has been having diffuse abdominal pain intermittently. Denies any nausea, vomiting, diarrhea, or constipation. Denies any weakness or fatigue. Denies any fevers or night sweats. Denies having any hematemesis or hematochezia. States he smoked in the past for a total of 6 months.     Past Medical History  Diagnosis Date  . Diabetes mellitus   . Hypertension   . Hyperlipidemia   . Morbid obesity (HCC)   . Constipation   . History of chest pain 2002   Current Outpatient Prescriptions  Medication Sig Dispense Refill  . aspirin 81 MG tablet Take 81 mg by mouth daily.      . Blood Glucose Monitoring Suppl (TRUE TRACK BLOOD GLUCOSE) DEVI 1 each by Does not apply route 2 (two) times daily.      Marland Kitchen glipiZIDE (GLUCOTROL XL) 10 MG 24 hr tablet Take 2 tablets (20 mg total) by mouth daily. 60 tablet 3  . glucose blood test strip 1 each by Other route as needed. Use as instructed     . hydrochlorothiazide (HYDRODIURIL) 25 MG tablet Take 1 tablet (25 mg total) by mouth daily. 30 tablet 11  . Insulin Glargine (LANTUS SOLOSTAR) 100 UNIT/ML Solostar Pen Inject 14 Units into the skin 2 (two) times daily. 45 mL 4  . Insulin Pen Needle 31G X 5 MM MISC Use once daily. 100 each 1  . LANCETS ULTRA THIN 30G MISC 1 each by Does not apply route 2 (two) times daily.      Marland Kitchen lisinopril (PRINIVIL,ZESTRIL) 40 MG tablet Take 1 tablet (40 mg total) by mouth daily. 90 tablet 3  . metFORMIN (GLUCOPHAGE-XR) 500 MG 24 hr tablet Take 4 tablets (2,000 mg total) by mouth daily with breakfast.  360 tablet 2  . metoprolol succinate (TOPROL-XL) 25 MG 24 hr tablet Take 1 tablet (25 mg total) by mouth daily. 90 tablet 3  . NIFEdipine (PROCARDIA XL) 60 MG 24 hr tablet Take 1 tablet (60 mg total) by mouth daily. 30 tablet 3  . polyethylene glycol (MIRALAX / GLYCOLAX) packet Take 17 g by mouth daily. 14 each 1  . rosuvastatin (CRESTOR) 20 MG tablet Take 1 tablet (20 mg total) by mouth at bedtime. 30 tablet 6   No current facility-administered medications for this visit.   Family History  Problem Relation Age of Onset  . Prostate cancer Neg Hx   . Colon cancer Neg Hx   . Diabetes Father   . Diabetes Maternal Grandmother    Social History   Social History  . Marital Status: Single    Spouse Name: N/A  . Number of Children: N/A  . Years of Education: 11   Occupational History  .    Marland Kitchen  Unemployed   Social History Main Topics  . Smoking status: Former Games developer  . Smokeless tobacco: Never Used  . Alcohol Use: 9.0 oz/week    15 Cans of beer per week     Comment: Drinks a pt of [employer] q  month/ about 24 oz can daily per pt  . Drug Use: Yes    Special: Marijuana     Comment: uses marijuana occasionally about 2 months ago.  Past cocaine use but currently clean  . Sexual Activity: Not Asked   Other Topics Concern  . None   Social History Narrative   Single, 1 daughter and 4 grandsons in good health.  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   Review of Systems: Review of Systems  Constitutional: Positive for weight loss. Negative for fever and malaise/fatigue.  HENT: Negative for ear pain.   Eyes: Negative for blurred vision and pain.  Respiratory: Negative for cough, shortness of breath and wheezing.   Cardiovascular: Negative for chest pain, palpitations and leg swelling.  Gastrointestinal: Positive for heartburn, abdominal pain and melena. Negative for nausea, vomiting, diarrhea, constipation and blood in stool.  Genitourinary: Negative for dysuria, urgency,  frequency and hematuria.  Musculoskeletal: Negative for myalgias and joint pain.  Skin: Negative for itching and rash.  Neurological: Negative for dizziness, sensory change, focal weakness and headaches.   Objective:  Physical Exam: Filed Vitals:   12/02/14 1516  BP: 138/83  Pulse: 103  Temp: 98.2 F (36.8 C)  TempSrc: Oral  Height:  (1.778 m)  Weight: 248 lb (112.492 kg)  SpO2: 100%   Physical Exam  Constitutional: He is oriented to person, place, and time. He appears well-developed and well-nourished. No distress.  HENT:  Head: Normocephalic and atraumatic.  Eyes: EOM are normal. Pupils are equal, round, and reactive to light.  Neck: Neck supple. No tracheal deviation present.  Cardiovascular: Normal rate, regular rhythm and intact distal pulses.   Pulmonary/Chest: Effort normal. No respiratory distress. He has no wheezes. He has no rales.  Abdominal: Soft. Bowel sounds are normal. He exhibits no distension and no mass. There is no tenderness. There is no rebound and no guarding.  Musculoskeletal: He exhibits no edema.  Neurological: He is alert and oriented to person, place, and time.  Skin: Skin is warm and dry.   Assessment & Plan:

## 2014-12-03 NOTE — Assessment & Plan Note (Signed)
Flu vaccine administered at this visit 

## 2014-12-03 NOTE — Telephone Encounter (Signed)
Health dept does not carry glipizide/ glucotrol XL, can you do a new script for regular glucotrol/ glipizide?

## 2014-12-03 NOTE — Assessment & Plan Note (Signed)
Lab Results  Component Value Date   HGBA1C 7.8 12/02/2014   HGBA1C 8.8 07/07/2014   HGBA1C 11.2 01/14/2014     Assessment: Diabetes control: uncontrolled  Progress toward A1C goal:   improved Comments: Patient endorses 1-2 episodes of hypoglycemia in the past when he skipped breakfast. States he does not check his blood glucose everyday adn does not keep a log.  Plan: Medications: Glipizide 20 mg qd, Lantus 14 u BID, and Metformin 2000 mg qd.  Home glucose monitoring: Encouraged patient to check his blood glucose once daily and keep a log. Asked him to bring his meter with him at the next visit.    Instruction/counseling given: Healthy eating and exercise.   Other plans:   -Ophthalmology referral

## 2014-12-03 NOTE — Telephone Encounter (Signed)
Called pt, informed him to call health dept

## 2014-12-05 MED ORDER — NIFEDIPINE ER OSMOTIC RELEASE 60 MG PO TB24: 60.0000 mg | ORAL_TABLET | Freq: Every day | ORAL | Status: DC

## 2014-12-06 NOTE — Telephone Encounter (Signed)
Rx phoned into pharmacy.

## 2014-12-07 ENCOUNTER — Telehealth: Payer: Self-pay

## 2014-12-07 NOTE — Telephone Encounter (Signed)
Previous pt of Dr. Arlyce Dice. Urgent referral in [facility]IC for Melena, abdominal pain, and unintentional weight loss. Nothing available with [facility] that I can schedule with. Advise.

## 2014-12-07 NOTE — Telephone Encounter (Signed)
Dr Christella Hartigan you were doc of the day, do you want to double book or try and schedule with any MD that has an opening?  The extenders are full as well.

## 2014-12-08 NOTE — Telephone Encounter (Signed)
His HB is normal, doubt signficant bleeding.  OK to double book for next Tuesday the 18th in AM.

## 2014-12-08 NOTE — Telephone Encounter (Signed)
Pt scheduled to see Dr. Christella HartiganJacobs 12/14/14@10 :45am. Attempted to call pt with appt at both numbers listed in chart. Unable to reach. Will try again.

## 2014-12-09 NOTE — Telephone Encounter (Signed)
Pt has been notified and will keep appt  

## 2014-12-10 NOTE — Progress Notes (Signed)
Internal Medicine Clinic Attending  I saw and evaluated the patient.  I personally confirmed the key portions of the history and exam documented by Dr. Rathore and I reviewed pertinent patient test results.  The assessment, diagnosis, and plan were formulated together and I agree with the documentation in the resident's note.  

## 2014-12-14 ENCOUNTER — Encounter: Payer: Self-pay | Admitting: Gastroenterology

## 2014-12-14 ENCOUNTER — Ambulatory Visit (INDEPENDENT_AMBULATORY_CARE_PROVIDER_SITE_OTHER): Payer: Self-pay | Admitting: Gastroenterology

## 2014-12-14 VITALS — BP 130/80 | HR 96 | Ht 70.0 in | Wt 255.0 lb

## 2014-12-14 DIAGNOSIS — K921 Melena: Secondary | ICD-10-CM

## 2014-12-14 NOTE — Patient Instructions (Addendum)
You will be set up for an upper endoscopy for melena 2 months ago.  Please call [phone] when you are ready to schedule.
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
## 2014-12-21 ENCOUNTER — Ambulatory Visit: Payer: Self-pay | Admitting: Physician Assistant

## 2014-12-30 ENCOUNTER — Telehealth: Payer: Self-pay | Admitting: Gastroenterology

## 2014-12-31 NOTE — Telephone Encounter (Signed)
Spoke to patient and he was ready to schedule his EGD appointment. Apt made for 02-15-15 and a pre-visit apt made for 01-11-15. Appointment info mailed out to patient.

## 2015-01-06 ENCOUNTER — Encounter: Payer: Self-pay | Admitting: Pulmonary Disease

## 2015-01-06 ENCOUNTER — Ambulatory Visit (INDEPENDENT_AMBULATORY_CARE_PROVIDER_SITE_OTHER): Payer: Self-pay | Admitting: Pulmonary Disease

## 2015-01-06 VITALS — BP 146/114 | HR 97 | Temp 98.1°F | Wt 260.9 lb

## 2015-01-06 DIAGNOSIS — R634 Abnormal weight loss: Secondary | ICD-10-CM

## 2015-01-06 DIAGNOSIS — I10 Essential (primary) hypertension: Secondary | ICD-10-CM

## 2015-01-06 MED ORDER — METOPROLOL SUCCINATE ER 50 MG PO TB24: 50.0000 mg | ORAL_TABLET | Freq: Every day | ORAL | Status: DC

## 2015-01-06 NOTE — Progress Notes (Signed)
Subjective:    Patient ID: Glenn Washington, male    DOB: 01/12/1956, 59 y.o.   MRN: [id number]  HPI Glenn Washington is a 59 year old man with history of DM, HTN, HLD, presenting for follow up of his hypertension.  He was last seen in clinic 12/02/2014. He reports compliance of his medications. Denies further episodes of melena. He has followed up with GI with plans for EGD for further evaluation.  Review of Systems Constitutional: no fevers/chills Eyes: no vision changes Ears, nose, mouth, throat, and face: no cough Respiratory: no shortness of breath Cardiovascular: no chest pain Gastrointestinal: no nausea/vomiting, no abdominal pain, no constipation, no diarrhea Genitourinary: no dysuria, no hematuria Integument: no rash Hematologic/lymphatic: no bleeding/bruising, no edema Musculoskeletal: no arthralgias, no myalgias Neurological: no paresthesias, no weakness  Past Medical History  Diagnosis Date  . Diabetes mellitus   . Hypertension   . Hyperlipidemia   . Morbid obesity (HCC)   . Constipation   . History of chest pain 2002    Current Outpatient Prescriptions on File Prior to Visit  Medication Sig Dispense Refill  . aspirin 81 MG tablet Take 81 mg by mouth daily.      . Blood Glucose Monitoring Suppl (TRUE TRACK BLOOD GLUCOSE) DEVI 1 each by Does not apply route 2 (two) times daily.      Marland Kitchen. glipiZIDE (GLUCOTROL XL) 10 MG 24 hr tablet Take 2 tablets (20 mg total) by mouth daily. 60 tablet 3  . glucose blood test strip 1 each by Other route as needed. Use as instructed     . hydrochlorothiazide (HYDRODIURIL) 25 MG tablet Take 1 tablet (25 mg total) by mouth daily. 30 tablet 11  . Insulin Glargine (LANTUS SOLOSTAR) 100 UNIT/ML Solostar Pen Inject 14 Units into the skin 2 (two) times daily. 45 mL 4  . Insulin Pen Needle 31G X 5 MM MISC Use once daily. 100 each 1  . LANCETS ULTRA THIN 30G MISC 1 each by Does not apply route 2 (two) times daily.      Marland Kitchen. lisinopril  (PRINIVIL,ZESTRIL) 40 MG tablet Take 1 tablet (40 mg total) by mouth daily. 90 tablet 3  . metFORMIN (GLUCOPHAGE-XR) 500 MG 24 hr tablet Take 4 tablets (2,000 mg total) by mouth daily with breakfast. 360 tablet 2  . metoprolol succinate (TOPROL-XL) 25 MG 24 hr tablet Take 1 tablet (25 mg total) by mouth daily. 90 tablet 3  . NIFEdipine (PROCARDIA XL) 60 MG 24 hr tablet Take 1 tablet (60 mg total) by mouth daily. 30 tablet 3  . polyethylene glycol (MIRALAX / GLYCOLAX) packet Take 17 g by mouth daily. 14 each 1  . rosuvastatin (CRESTOR) 20 MG tablet Take 1 tablet (20 mg total) by mouth at bedtime. 30 tablet 6   No current facility-administered medications on file prior to visit.    Today's Vitals   01/06/15 1439 01/06/15 1459  BP: 152/93 146/114  Pulse: 106 97  Temp: 98.1 F (36.7 C)   TempSrc: Oral   Weight: 260 lb 14.4 oz (118.343 kg)   SpO2: 98%    Objective:  Physical Exam  Constitutional: He is oriented to person, place, and time. He appears well-developed and well-nourished. No distress.  HENT:  Head: Normocephalic and atraumatic.  Mouth/Throat: Oropharynx is clear and moist.  Eyes: EOM are normal.  Neck: Neck supple.  Cardiovascular: Normal rate and regular rhythm.   No murmur heard. Pulmonary/Chest: Effort normal and breath sounds normal. No respiratory distress. He has  no wheezes.  Abdominal: Soft. He exhibits no distension. There is no tenderness.  Musculoskeletal: Normal range of motion. He exhibits no edema.  Neurological: He is alert and oriented to person, place, and time. No cranial nerve deficit.  Skin: Skin is warm and dry.  Psychiatric: He has a normal mood and affect.   Assessment & Plan:  Please refer to problem based charting.

## 2015-01-07 NOTE — Assessment & Plan Note (Signed)
BP Readings from Last 3 Encounters:  01/06/15 146/114  12/14/14 130/80  12/02/14 138/83    Lab Results  Component Value Date   NA 133* 05/12/2013   K 4.2 05/12/2013   CREATININE 0.98 05/12/2013    Assessment: Blood pressure control: Uncontrolled Progress toward BP goal:  Deteriorated  Plan: Medications:  Continue HCTZ 25mg  daily, Procardia XL 60mg  daily. Increase Toprol XL to 50mg  daily.

## 2015-01-07 NOTE — Assessment & Plan Note (Signed)
Assessment:  Weights: 283lb 01/14/2014 273 07/07/2014 248 12/02/2014 255 12/14/2014 260 01/06/2015  Patient has gained 5 lbs from last visit. Undergoing workup by GI  Plan: -Will follow up on GI workup.

## 2015-01-10 NOTE — Progress Notes (Signed)
Internal Medicine Clinic Attending  Case discussed with Dr. Krall soon after the resident saw the patient.  We reviewed the resident's history and exam and pertinent patient test results.  I agree with the assessment, diagnosis, and plan of care documented in the resident's note. 

## 2015-01-13 ENCOUNTER — Ambulatory Visit (AMBULATORY_SURGERY_CENTER): Payer: Self-pay

## 2015-01-13 VITALS — Ht 72.0 in | Wt 258.0 lb

## 2015-01-13 DIAGNOSIS — K921 Melena: Secondary | ICD-10-CM

## 2015-01-13 NOTE — Progress Notes (Signed)
No egg or soy allergies Not on home 02 No previous anesthesia complications No diet or weight loss meds 

## 2015-01-17 ENCOUNTER — Telehealth: Payer: Self-pay

## 2015-01-17 MED ORDER — METOPROLOL TARTRATE 25 MG PO TABS: 25.0000 mg | ORAL_TABLET | Freq: Two times a day (BID) | ORAL | Status: DC

## 2015-01-17 NOTE — Telephone Encounter (Signed)
[facility] no longer carry patients metoprolol succinate. They carry metoprolol tartrate and have advised that it would be sam mg dose.  Can rx be updated?

## 2015-01-17 NOTE — Telephone Encounter (Signed)
I discontinued his metoprolol succinate 50mg  daily and changed him to metoprolol tartrate 25mg  BID.  Griffin BasilJennifer Lucciana Head, MD  Internal Medicine PGY-2

## 2015-01-19 ENCOUNTER — Encounter: Payer: Self-pay | Admitting: *Deleted

## 2015-01-19 NOTE — Addendum Note (Signed)
Addended by: Neomia DearPOWERS, Luciel Brickman E on: 01/19/2015 12:23 PM   Modules accepted: Orders

## 2015-02-14 ENCOUNTER — Telehealth: Payer: Self-pay | Admitting: Gastroenterology

## 2015-02-14 NOTE — Telephone Encounter (Signed)
Attempted to call patient on his home phone and cell phone with no answer or no voicemail to leave message.  I will attempt to call again later.

## 2015-02-15 ENCOUNTER — Ambulatory Visit (AMBULATORY_SURGERY_CENTER): Payer: Self-pay | Admitting: Gastroenterology

## 2015-02-15 ENCOUNTER — Encounter: Payer: Self-pay | Admitting: Gastroenterology

## 2015-02-15 VITALS — BP 132/79 | HR 81 | Temp 96.7°F | Resp 19 | Ht 72.0 in | Wt 258.0 lb

## 2015-02-15 DIAGNOSIS — K2970 Gastritis, unspecified, without bleeding: Secondary | ICD-10-CM

## 2015-02-15 DIAGNOSIS — K2990 Gastroduodenitis, unspecified, without bleeding: Secondary | ICD-10-CM

## 2015-02-15 DIAGNOSIS — K921 Melena: Secondary | ICD-10-CM

## 2015-02-15 DIAGNOSIS — B9681 Helicobacter pylori [H. pylori] as the cause of diseases classified elsewhere: Secondary | ICD-10-CM

## 2015-02-15 DIAGNOSIS — K2950 Unspecified chronic gastritis without bleeding: Secondary | ICD-10-CM

## 2015-02-15 DIAGNOSIS — K259 Gastric ulcer, unspecified as acute or chronic, without hemorrhage or perforation: Secondary | ICD-10-CM

## 2015-02-15 LAB — GLUCOSE, CAPILLARY
GLUCOSE-CAPILLARY: 243 mg/dL — AB (ref 65–99)
Glucose-Capillary: 223 mg/dL — ABNORMAL HIGH (ref 65–99)

## 2015-02-15 MED ORDER — SODIUM CHLORIDE 0.9 % IV SOLN: 500.0000 mL | INTRAVENOUS | Status: DC

## 2015-02-15 MED ORDER — OMEPRAZOLE 40 MG PO CPDR: 40.0000 mg | DELAYED_RELEASE_CAPSULE | Freq: Every day | ORAL | Status: DC

## 2015-02-15 NOTE — Op Note (Signed)
[facility] [address] [place], [id number]   ENDOSCOPY PROCEDURE REPORT  PATIENT: Glenn AlconMccain, [name]  MR#: [id number] BIRTHDATE: [birth year] , 59  yrs. old GENDER: male ENDOSCOPIST: Rachael Feeaniel P Breven Guidroz, MD REFERRED BY:  Griffin BasilJennifer Krall, MD PROCEDURE DATE:  02/15/2015 PROCEDURE:  EGD w/ biopsy ASA CLASS:     Class II INDICATIONS:  melena, normal CBC (2 months ago), no NSAIDs but on ASA once daily (unopposed by PPI). MEDICATIONS: Monitored anesthesia care and Propofol 200 mg IV TOPICAL ANESTHETIC: none  DESCRIPTION OF PROCEDURE: After the risks benefits and alternatives of the procedure were thoroughly explained, informed consent was obtained.  The LB [id number] endoscope was introduced through the mouth and advanced to the second portion of the duodenum , Without limitations.  The instrument was slowly withdrawn as the mucosa was fully examined.    There was a clean based 5mm pre-pyloric ulcer.  This did not appear malignant.  There was moderate to severe, non-specific pangastritis.  The stomach was biopsied in antrum and body and sent to pathology.  The examination was otherwise normal.  Retroflexed views revealed no abnormalities.     The scope was then withdrawn from the patient and the procedure completed.  COMPLICATIONS: There were no immediate complications.  ENDOSCOPIC IMPRESSION: There was a clean based 5mm pre-pyloric ulcer.  This did not appear malignant.  There was moderate to severe, non-specific pangastritis.  The stomach was biopsied in antrum and body and sent to pathology.  The examination was otherwise normal  RECOMMENDATIONS: Await biopsy results, for now please start once daily PPI (a new prescription was called into your pharmacy today.  If the biopsies show H. pylori infection, you will also be started on appropriate antibiotics.   eSigned:  Rachael Feeaniel P Anyi Fels, MD 02/15/2015 9:38 AM

## 2015-02-15 NOTE — Progress Notes (Signed)
Report to PACU, RN, vss, BBS= Clear.  

## 2015-02-15 NOTE — Patient Instructions (Signed)
YOU HAD AN ENDOSCOPIC PROCEDURE TODAY AT THE [facility]:   Refer to the procedure report that was given to you for any specific questions about what was found during the examination.  If the procedure report does not answer your questions, please call your gastroenterologist to clarify.  If you requested that your care partner not be given the details of your procedure findings, then the procedure report has been included in a sealed envelope for you to review at your convenience later.  YOU SHOULD EXPECT: Some feelings of bloating in the abdomen. Passage of more gas than usual.  Walking can help get rid of the air that was put into your GI tract during the procedure and reduce the bloating. If you had a lower endoscopy (such as a colonoscopy or flexible sigmoidoscopy) you may notice spotting of blood in your stool or on the toilet paper. If you underwent a bowel prep for your procedure, you may not have a normal bowel movement for a few days.  Please Note:  You might notice some irritation and congestion in your nose or some drainage.  This is from the oxygen used during your procedure.  There is no need for concern and it should clear up in a day or so.  SYMPTOMS TO REPORT IMMEDIATELY:    Following upper endoscopy (EGD)  Vomiting of blood or coffee ground material  New chest pain or pain under the shoulder blades  Painful or persistently difficult swallowing  New shortness of breath  Fever of 100F or higher  Black, tarry-looking stools  For urgent or emergent issues, a gastroenterologist can be reached at any hour by calling [phone].   DIET: Your first meal following the procedure should be a small meal and then it is ok to progress to your normal diet. Heavy or fried foods are harder to digest and may make you feel nauseous or bloated.  Likewise, meals heavy in dairy and vegetables can increase bloating.  Drink plenty of fluids but you should avoid alcoholic beverages  for 24 hours.  ACTIVITY:  You should plan to take it easy for the rest of today and you should NOT DRIVE or use heavy machinery until tomorrow (because of the sedation medicines used during the test).    FOLLOW UP: Our staff will call the number listed on your records the next business day following your procedure to check on you and address any questions or concerns that you may have regarding the information given to you following your procedure. If we do not reach you, we will leave a message.  However, if you are feeling well and you are not experiencing any problems, there is no need to return our call.  We will assume that you have returned to your regular daily activities without incident.  If any biopsies were taken you will be contacted by phone or by letter within the next 1-3 weeks.  Please call us at [phone] if you have not heard about the biopsies in 3 weeks.    SIGNATURES/CONFIDENTIALITY: You and/or your care partner have signed paperwork which will be entered into your electronic medical record.  These signatures attest to the fact that that the information above on your After Visit Summary has been reviewed and is understood.  Full responsibility of the confidentiality of this discharge information lies with you and/or your care-partner.   Resume medications. Information given on gastritis. 

## 2015-02-15 NOTE — Progress Notes (Signed)
[facility]  She stated that she had not received order for prilosec.verbal order given for prilosec 40 mg daily. Pt. Made aware that he need to pick medication up and take 20-30 minutes before breakfast.

## 2015-02-15 NOTE — Progress Notes (Signed)
Called to room to assist during endoscopic procedure.  Patient ID and intended procedure confirmed with present staff. Received instructions for my participation in the procedure from the performing physician.  

## 2015-02-16 ENCOUNTER — Telehealth: Payer: Self-pay | Admitting: *Deleted

## 2015-02-16 NOTE — Telephone Encounter (Signed)
  Follow up Call-  Call back number 02/15/2015 06/30/2013  Post procedure Call Back phone  # [phone]  Permission to leave phone message Yes Yes     Patient questions:  Do you have a fever, pain , or abdominal swelling? No. Pain Score  0 *  Have you tolerated food without any problems? Yes.    Have you been able to return to your normal activities? Yes.    Do you have any questions about your discharge instructions: Diet   No. Medications  No. Follow up visit  No.  Do you have questions or concerns about your Care? No.  Actions: * If pain score is 4 or above: No action needed, pain <4.

## 2015-02-17 ENCOUNTER — Encounter: Payer: Self-pay | Admitting: Pulmonary Disease

## 2015-02-17 DIAGNOSIS — H5203 Hypermetropia, bilateral: Secondary | ICD-10-CM | POA: Insufficient documentation

## 2015-02-17 DIAGNOSIS — H524 Presbyopia: Secondary | ICD-10-CM

## 2015-02-17 DIAGNOSIS — H52203 Unspecified astigmatism, bilateral: Secondary | ICD-10-CM

## 2015-03-10 ENCOUNTER — Telehealth: Payer: Self-pay | Admitting: Gastroenterology

## 2015-03-11 MED ORDER — BISMUTH SUBSALICYLATE 262 MG PO TABS: 2.0000 | ORAL_TABLET | Freq: Four times a day (QID) | ORAL | Status: DC

## 2015-03-11 MED ORDER — BIS SUBCIT-METRONID-TETRACYC 140-125-125 MG PO CAPS: 3.0000 | ORAL_CAPSULE | Freq: Three times a day (TID) | ORAL | Status: DC

## 2015-03-11 MED ORDER — TETRACYCLINE HCL 500 MG PO CAPS: 500.0000 mg | ORAL_CAPSULE | Freq: Four times a day (QID) | ORAL | Status: DC

## 2015-03-11 MED ORDER — METRONIDAZOLE 500 MG PO TABS: 500.0000 mg | ORAL_TABLET | Freq: Four times a day (QID) | ORAL | Status: DC

## 2015-03-11 NOTE — Telephone Encounter (Signed)
Pt aware and prescription for the break down of pylera has been sent due to the cost of pylera.  Pt also notified to take omeprazole twice daily for 14 days

## 2015-03-11 NOTE — Telephone Encounter (Signed)
Biopsies from stomach are positive for H. Pylori. Please call in PMPT 14 day therapy:  First try 14 day course of Pylera. At the same time they need to be on twice daily PPI. If currently only on once daily PPI then they should supplement with OTC omeprazole 20mg  pill daily as well (12 hours after the first PPI dose).  If Pylera is cost prohibitive, then please call in it's component parts (each for 14 days) as follows: Bismuth subsalicylate 262mg  pills, two pills QID; Metronidazoe 500mg  pill,one pill QID; Tetracycline 500mg  pill, one pill QID. At the same time they need to be on twice daily PPI. If currently only on once daily PPI then they should supplement with OTC omeprazole 20mg  pill daily as well (12 hours after the first PPI dose).

## 2015-03-15 ENCOUNTER — Telehealth: Payer: Self-pay | Admitting: Gastroenterology

## 2015-03-15 MED ORDER — BIS SUBCIT-METRONID-TETRACYC 140-125-125 MG PO CAPS: 3.0000 | ORAL_CAPSULE | Freq: Three times a day (TID) | ORAL | Status: DC

## 2015-03-15 NOTE — Telephone Encounter (Signed)
Pt has been given a sample course of Pylera.  He will pick this up at the front desk.

## 2015-03-28 ENCOUNTER — Telehealth: Payer: Self-pay | Admitting: Gastroenterology

## 2015-03-28 NOTE — Telephone Encounter (Signed)
Pt aware to complete the course of antibiotics until finished.  Pt verbalizes understanding.

## 2015-04-12 ENCOUNTER — Ambulatory Visit: Payer: Self-pay

## 2015-04-13 ENCOUNTER — Telehealth: Payer: Self-pay | Admitting: Pulmonary Disease

## 2015-04-13 NOTE — Telephone Encounter (Signed)
APPT. REMINDER CALL, NO ANSWER, NO VOICE MAIL °

## 2015-04-14 ENCOUNTER — Encounter: Payer: Self-pay | Admitting: Pulmonary Disease

## 2015-05-12 ENCOUNTER — Ambulatory Visit (INDEPENDENT_AMBULATORY_CARE_PROVIDER_SITE_OTHER): Payer: Self-pay | Admitting: Pulmonary Disease

## 2015-05-12 VITALS — BP 160/110 | HR 87 | Temp 98.1°F | Ht 70.0 in | Wt 269.9 lb

## 2015-05-12 DIAGNOSIS — Z9114 Patient's other noncompliance with medication regimen: Secondary | ICD-10-CM

## 2015-05-12 DIAGNOSIS — Z8719 Personal history of other diseases of the digestive system: Secondary | ICD-10-CM

## 2015-05-12 DIAGNOSIS — I10 Essential (primary) hypertension: Secondary | ICD-10-CM

## 2015-05-12 DIAGNOSIS — E1129 Type 2 diabetes mellitus with other diabetic kidney complication: Secondary | ICD-10-CM

## 2015-05-12 DIAGNOSIS — Z09 Encounter for follow-up examination after completed treatment for conditions other than malignant neoplasm: Secondary | ICD-10-CM

## 2015-05-12 DIAGNOSIS — Z79899 Other long term (current) drug therapy: Secondary | ICD-10-CM

## 2015-05-12 DIAGNOSIS — Z794 Long term (current) use of insulin: Secondary | ICD-10-CM

## 2015-05-12 DIAGNOSIS — E1165 Type 2 diabetes mellitus with hyperglycemia: Secondary | ICD-10-CM

## 2015-05-12 DIAGNOSIS — IMO0002 Reserved for concepts with insufficient information to code with codable children: Secondary | ICD-10-CM

## 2015-05-12 DIAGNOSIS — A048 Other specified bacterial intestinal infections: Secondary | ICD-10-CM

## 2015-05-12 DIAGNOSIS — B9681 Helicobacter pylori [H. pylori] as the cause of diseases classified elsewhere: Secondary | ICD-10-CM

## 2015-05-12 LAB — GLUCOSE, CAPILLARY: Glucose-Capillary: 215 mg/dL — ABNORMAL HIGH (ref 65–99)

## 2015-05-12 LAB — POCT GLYCOSYLATED HEMOGLOBIN (HGB A1C): Hemoglobin A1C: 10.9

## 2015-05-12 MED ORDER — INSULIN GLARGINE 100 UNIT/ML SOLOSTAR PEN: 28.0000 [IU] | PEN_INJECTOR | Freq: Every day | SUBCUTANEOUS | Status: DC

## 2015-05-12 NOTE — Patient Instructions (Signed)
Please take your metformin 4 tablets daily Please take your glipizide 2 tablets daily Please take your insulin 28 units daily  Please follow up in 3-4 weeks with your glucose meter.

## 2015-05-12 NOTE — Progress Notes (Signed)
Subjective:    Patient ID: Glenn Washington, male    DOB: 08/03/1955, 60 y.o.   MRN: [id number]  HPI Glenn Washington is a 60 year old man with history of DM2, HTN, HLD, obesity, GERD presenting for follow up of DM2.  EGD 02/15/2015 and found to have clean based 5mm pre-pyloric ulcer and moderate to severe, non-specific pangastritis. No malignancy on biopsy. Positive for H Pylori. Prescribed 14 day course bismuth, Metronidazole, Tetracycline and omeprazole on 03/28/2015. Completed his medication.  He reports he has been noncompliant of his medications. He has been eating more fried foods and sugary foods. He takes 23-24 units of Lantus daily. He takes 2 tablets of glipzide. He takes 4 tablets of metformin. The lowest blood sugar measured has been 180. Denies episodes of symptomatic hypoglycemia.  Review of Systems Constitutional: no fevers/chills Ears, nose, mouth, throat, and face: no cough Respiratory: no shortness of breath Cardiovascular: no chest pain Gastrointestinal: no nausea/vomiting, no abdominal pain, no constipation, no diarrhea  Past Medical History  Diagnosis Date  . Diabetes mellitus   . Hypertension   . Hyperlipidemia   . Morbid obesity (HCC)   . Constipation   . History of chest pain 2002  . GERD (gastroesophageal reflux disease)     Current Outpatient Prescriptions on File Prior to Visit  Medication Sig Dispense Refill  . aspirin 81 MG tablet Take 81 mg by mouth daily.      . Blood Glucose Monitoring Suppl (TRUE TRACK BLOOD GLUCOSE) DEVI 1 each by Does not apply route 2 (two) times daily.      Marland Kitchen. glipiZIDE (GLUCOTROL XL) 10 MG 24 hr tablet Take 2 tablets (20 mg total) by mouth daily. 60 tablet 3  . glucose blood test strip 1 each by Other route as needed. Use as instructed     . hydrochlorothiazide (HYDRODIURIL) 25 MG tablet Take 1 tablet (25 mg total) by mouth daily. 30 tablet 11  . Insulin Pen Needle 31G X 5 MM MISC Use once daily. 100 each 1  . LANCETS ULTRA THIN  30G MISC 1 each by Does not apply route 2 (two) times daily.      Marland Kitchen. lisinopril (PRINIVIL,ZESTRIL) 40 MG tablet Take 1 tablet (40 mg total) by mouth daily. 90 tablet 3  . metFORMIN (GLUCOPHAGE-XR) 500 MG 24 hr tablet Take 4 tablets (2,000 mg total) by mouth daily with breakfast. 360 tablet 2  . metoprolol tartrate (LOPRESSOR) 25 MG tablet Take 1 tablet (25 mg total) by mouth 2 (two) times daily. 60 tablet 3  . NIFEdipine (PROCARDIA XL) 60 MG 24 hr tablet Take 1 tablet (60 mg total) by mouth daily. 30 tablet 3  . omeprazole (PRILOSEC) 40 MG capsule Take 1 capsule (40 mg total) by mouth daily before breakfast. 90 capsule 3  . polyethylene glycol (MIRALAX / GLYCOLAX) packet Take 17 g by mouth daily. (Patient not taking: Reported on 01/13/2015) 14 each 1  . rosuvastatin (CRESTOR) 20 MG tablet Take 1 tablet (20 mg total) by mouth at bedtime. 30 tablet 6   No current facility-administered medications on file prior to visit.    Objective:   Physical Exam Blood pressure 160/110, pulse 87, temperature 98.1 F (36.7 C), temperature source Oral, height 5\' 10"  (1.778 m), weight 269 lb 14.4 oz (122.426 kg), SpO2 100 %. General Apperance: NAD HEENT: Normocephalic, atraumatic, anicteric sclera Neck: Supple, trachea midline Lungs: Clear to auscultation bilaterally. No wheezes, rhonchi or rales. Breathing comfortably Heart: Regular rate and rhythm, no murmur/rub/gallop  Abdomen: Soft, nontender, nondistended, no rebound/guarding Extremities: Warm and well perfused, no edema Skin: No rashes or lesions Neurologic: Alert and interactive. No gross deficits.    Assessment & Plan:  Essential hypertension BP Readings from Last 3 Encounters:  05/12/15 160/110  02/15/15 132/79  01/06/15 146/114    Lab Results  Component Value Date   NA 141 05/12/2015   K 4.8 05/12/2015   CREATININE 0.99 05/12/2015    Assessment: Blood pressure control: Uncontrolled Progress toward BP goal:  Deteriorated Comments:  Noncompliant with medications  Plan: Medications:  Continue HCTZ  daily, lisinopril  daily, metoprolol  BID Follow up in 1 month   Diabetes mellitus type 2, uncontrolled (HCC) Lab Results  Component Value Date   HGBA1C 10.9 05/12/2015   HGBA1C 7.8 12/02/2014   HGBA1C 8.8 07/07/2014     Assessment: Diabetes control: Deteriorated Comments: Dietary indiscretions and medication noncompliance.  Plan: Medications:  Continue glipizide  daily, Lantus 28u daily, Metformin XR  daily Other plans:  -Follow up in 1 month.     Helicobacter pylori infection Assessment: Treated with Pylera and omeprazole.  Plan: Check fecal antigen for eradication   Griffin Basil, MD  Internal Medicine Teaching Service PGY-2 05/15/2015 1:19 PM

## 2015-05-13 LAB — BMP8+ANION GAP
ANION GAP: 22 mmol/L — AB (ref 10.0–18.0)
BUN/Creatinine Ratio: 15 (ref 10–22)
BUN: 15 mg/dL (ref 8–27)
CALCIUM: 9.8 mg/dL (ref 8.6–10.2)
CO2: 19 mmol/L (ref 18–29)
CREATININE: 0.99 mg/dL (ref 0.76–1.27)
Chloride: 100 mmol/L (ref 96–106)
GFR calc Af Amer: 95 mL/min/{1.73_m2} (ref >59)
GFR, EST NON AFRICAN AMERICAN: 82 mL/min/{1.73_m2} (ref >59)
Glucose: 204 mg/dL — ABNORMAL HIGH (ref 65–99)
Potassium: 4.8 mmol/L (ref 3.5–5.2)
SODIUM: 141 mmol/L (ref 134–144)

## 2015-05-13 LAB — LIPID PANEL
CHOL/HDL RATIO: 3.3 ratio (ref 0.0–5.0)
CHOLESTEROL TOTAL: 230 mg/dL — AB (ref 100–199)
HDL: 69 mg/dL (ref >39)
LDL CALC: 111 mg/dL — AB (ref 0–99)
TRIGLYCERIDES: 248 mg/dL — AB (ref 0–149)
VLDL CHOLESTEROL CAL: 50 mg/dL — AB (ref 5–40)

## 2015-05-14 NOTE — Assessment & Plan Note (Addendum)
BP Readings from Last 3 Encounters:  05/12/15 160/110  02/15/15 132/79  01/06/15 146/114    Lab Results  Component Value Date   NA 141 05/12/2015   K 4.8 05/12/2015   CREATININE 0.99 05/12/2015    Assessment: Blood pressure control: Uncontrolled Progress toward BP goal:  Deteriorated Comments: Noncompliant with medications  Plan: Medications:  Continue HCTZ 25mg  daily, lisinopril 40mg  daily, metoprolol 25mg  BID Follow up in 1 month

## 2015-05-15 NOTE — Assessment & Plan Note (Signed)
Assessment: Treated with Pylera and omeprazole.  Plan: Check fecal antigen for eradication

## 2015-05-15 NOTE — Assessment & Plan Note (Signed)
Lab Results  Component Value Date   HGBA1C 10.9 05/12/2015   HGBA1C 7.8 12/02/2014   HGBA1C 8.8 07/07/2014     Assessment: Diabetes control: Deteriorated Comments: Dietary indiscretions and medication noncompliance.  Plan: Medications:  Continue glipizide 20mg  daily, Lantus 28u daily, Metformin XR 2000mg  daily Other plans:  -Follow up in 1 month.

## 2015-05-19 NOTE — Progress Notes (Signed)
Internal Medicine Clinic Attending  Case discussed with Dr. Krall at the time of the visit.  We reviewed the resident's history and exam and pertinent patient test results.  I agree with the assessment, diagnosis, and plan of care documented in the resident's note.  

## 2015-05-27 ENCOUNTER — Telehealth: Payer: Self-pay | Admitting: Dietician

## 2015-05-27 NOTE — Telephone Encounter (Signed)
Patient mentioned at her last appointment that he would like to come in more regularly. He does not have an appointment and is due to follow up late April.

## 2015-06-22 ENCOUNTER — Other Ambulatory Visit: Payer: Self-pay

## 2015-06-22 ENCOUNTER — Telehealth: Payer: Self-pay | Admitting: Dietician

## 2015-06-22 ENCOUNTER — Ambulatory Visit (INDEPENDENT_AMBULATORY_CARE_PROVIDER_SITE_OTHER): Payer: Self-pay | Admitting: Dietician

## 2015-06-22 VITALS — Wt 271.6 lb

## 2015-06-22 DIAGNOSIS — E1165 Type 2 diabetes mellitus with hyperglycemia: Secondary | ICD-10-CM

## 2015-06-22 DIAGNOSIS — Z794 Long term (current) use of insulin: Secondary | ICD-10-CM

## 2015-06-22 DIAGNOSIS — E1129 Type 2 diabetes mellitus with other diabetic kidney complication: Secondary | ICD-10-CM

## 2015-06-22 DIAGNOSIS — IMO0002 Reserved for concepts with insufficient information to code with codable children: Secondary | ICD-10-CM

## 2015-06-22 DIAGNOSIS — Z713 Dietary counseling and surveillance: Secondary | ICD-10-CM

## 2015-06-22 LAB — GLUCOSE, CAPILLARY: GLUCOSE-CAPILLARY: 306 mg/dL — AB (ref 65–99)

## 2015-06-22 NOTE — Patient Instructions (Signed)
PLEASE SCHEDULE YOUR DOCTOR APPOINTMENT  I WILL CHECK ON YOUR CRESTOR REFILL  I SUGGEST YOU TAKE THE WHOLE 28 UNITS LANTUS IN THE MORNING WITH ALL 4 METFORMIN PILLS AND THE GLUIPIZIDE

## 2015-06-22 NOTE — Telephone Encounter (Signed)
PATIENT NOT TAKING CRESTOR. He asked diabetes educator to call the health department to see if he has any refills. [facility] saying that they have never filled crestor for him and it is not available through [facility]. He did get lipitor 80 mg for 1 month supply in August 2016 instead of the crestor. Can use orange card to purchase the following from the pharmacy:  atorvastatin, simvastatin and pravastatin

## 2015-06-22 NOTE — Progress Notes (Signed)
  Medical Nutrition Therapy:  Appt start time: 1045 end time:  1125. Visit # 1/last visit was   Assessment:  Primary concerns today: glycemic control "I think I figured out my diabetes" He says that drinking juice or milk and eating fig bars late at night are causing his blood sugars to be high. He also added that he spreads his medicine out over the day despite it being prescribed as onc a day medicine. He proudly announced that he is now eating breakfast where as he used to not eat in the morning, but he always skips lunch  Preferred Learning Style:No preference indicated  Learning Readiness: Contemplating  LABS: A1c- 10.9%, LDL-111 ANTHROPOMETRICS: weight-271.6#, ZOX-09-60BMI-38-39- increased over past year WEIGHT HISTORY:stable SLEEP:need to assess at future visit MEDICATIONS: not taking diabetes medicine as prescribed one time a day which increases the possibly that he misses taking some daily BLOOD SUGAR: he did not bring his meter reports his blood sugars are 119-300s, this am before eating it was 229, after ham sandwich and glass of milk in office it was 306. He says he took only 20 units lantus yesterday so he could take the 8 later when he needed it DIETARY INTAKE: Usual eating pattern includes 1-2 meals and snacks throughout the afternoon sometimes and often at night.  24-hr recall:  B ( AM): ham sandwich and milk L ( PM): skips Snk ( PM):unknown D ( PM): meat like chicken, vegetables, bread, starch Snk ( PM): juice and fig bars Beverages: beer on occasion, milk  Usual physical activity: need to assess at future visit  Progress Towards Goal(s):  In progress.   Nutritional Diagnosis:  [place]-2.2 Altered nutrition-related laboratory As related to excess sugar in his blood caused by food choices and non adherence with his diabetes medicine.  As evidenced by his report and a1C of 10.9%.    Intervention:  Nutrition education and counseling about reasons for not taking medicine as directed,  barriers to changing food intake. Coordination of care: note sent to doctor about crestor- patient not on a statin Teaching Method Utilized: Visual,,Auditory,Hands on Handouts given during visit include: Barriers to learning/adherence to lifestyle change: competing values Demonstrated degree of understanding via:  Teach Back   Monitoring/Evaluation:  Dietary intake, exercise, meter, and body weight in 6 week(s).

## 2015-06-23 MED ORDER — ATORVASTATIN CALCIUM 40 MG PO TABS: 40.0000 mg | ORAL_TABLET | Freq: Every day | ORAL | Status: DC

## 2015-06-23 NOTE — Addendum Note (Signed)
Addended by: Griffin BasilKRALL, Negar Sieler T on: 06/23/2015 11:13 AM   Modules accepted: Orders, Medications

## 2015-06-23 NOTE — Telephone Encounter (Signed)
Can change him from crestor to atorvastatin 40mg  daily. Rx ordered.

## 2015-06-24 LAB — H. PYLORI ANTIGEN, STOOL: H pylori Ag, Stl: NEGATIVE

## 2015-07-15 ENCOUNTER — Telehealth: Payer: Self-pay | Admitting: Dietician

## 2015-07-15 NOTE — Telephone Encounter (Signed)
Called to follow up  On how his blood sugars and lifestyle change are going:  He says he is Exercising every day- walks twice a day.  Eating more Broccoli, veggies  and fruits but not too much fruit.  Feel better, more energy. Seeing blood sugars low 200s wants it to be 120s Says he is taking Glipizide,  Metformin- and Lantus- 28 units, not every day, wants a faster acting insulin... We talked about the pros and cons of long acting vs fast acting insulin. He was encouraged to take the lantus every day and make an appointment to see his doctor ASAP

## 2015-08-09 ENCOUNTER — Ambulatory Visit (INDEPENDENT_AMBULATORY_CARE_PROVIDER_SITE_OTHER): Payer: Self-pay | Admitting: Pulmonary Disease

## 2015-08-09 ENCOUNTER — Telehealth: Payer: Self-pay | Admitting: *Deleted

## 2015-08-09 ENCOUNTER — Ambulatory Visit (INDEPENDENT_AMBULATORY_CARE_PROVIDER_SITE_OTHER): Payer: Self-pay | Admitting: Dietician

## 2015-08-09 ENCOUNTER — Encounter: Payer: Self-pay | Admitting: Dietician

## 2015-08-09 ENCOUNTER — Encounter: Payer: Self-pay | Admitting: Pulmonary Disease

## 2015-08-09 VITALS — BP 136/96 | HR 81 | Temp 98.4°F | Ht 72.0 in | Wt 267.0 lb

## 2015-08-09 DIAGNOSIS — IMO0001 Reserved for inherently not codable concepts without codable children: Secondary | ICD-10-CM

## 2015-08-09 DIAGNOSIS — Z713 Dietary counseling and surveillance: Secondary | ICD-10-CM

## 2015-08-09 DIAGNOSIS — Z794 Long term (current) use of insulin: Secondary | ICD-10-CM

## 2015-08-09 DIAGNOSIS — Z79899 Other long term (current) drug therapy: Secondary | ICD-10-CM

## 2015-08-09 DIAGNOSIS — E1165 Type 2 diabetes mellitus with hyperglycemia: Secondary | ICD-10-CM

## 2015-08-09 DIAGNOSIS — I10 Essential (primary) hypertension: Secondary | ICD-10-CM

## 2015-08-09 LAB — POCT GLYCOSYLATED HEMOGLOBIN (HGB A1C): HEMOGLOBIN A1C: 12.5

## 2015-08-09 LAB — GLUCOSE, CAPILLARY: Glucose-Capillary: 221 mg/dL — ABNORMAL HIGH (ref 65–99)

## 2015-08-09 MED ORDER — LISINOPRIL 40 MG PO TABS: 40.0000 mg | ORAL_TABLET | Freq: Every day | ORAL | Status: DC

## 2015-08-09 MED ORDER — INSULIN GLARGINE 100 UNIT/ML SOLOSTAR PEN: 30.0000 [IU] | PEN_INJECTOR | Freq: Every day | SUBCUTANEOUS | Status: DC

## 2015-08-09 MED ORDER — METOPROLOL TARTRATE 25 MG PO TABS: 25.0000 mg | ORAL_TABLET | Freq: Two times a day (BID) | ORAL | Status: DC

## 2015-08-09 MED ORDER — GLIPIZIDE ER 10 MG PO TB24: 20.0000 mg | ORAL_TABLET | Freq: Every day | ORAL | Status: DC

## 2015-08-09 MED ORDER — HYDROCHLOROTHIAZIDE 25 MG PO TABS: 25.0000 mg | ORAL_TABLET | Freq: Every day | ORAL | Status: DC

## 2015-08-09 MED ORDER — ATORVASTATIN CALCIUM 40 MG PO TABS: 40.0000 mg | ORAL_TABLET | Freq: Every day | ORAL | Status: DC

## 2015-08-09 MED ORDER — METFORMIN HCL ER 500 MG PO TB24: 2000.0000 mg | ORAL_TABLET | Freq: Every day | ORAL | Status: DC

## 2015-08-09 NOTE — Assessment & Plan Note (Signed)
Assessment: Deteriorated A1c to 12.5 from 10.9.  Plan:  Continue glipizide 20mg  daily Continue metformin 2000mg  daily Increase Lantus to 30u daily. Instructed patient to increase insulin by 2 units every week until fasting CBG is less than 130. Follow up in 3-4 weeks

## 2015-08-09 NOTE — Progress Notes (Signed)
Subjective:    Patient ID: Glenn Washington, male    DOB: 06/21/1955, 60 y.o.   MRN: [id number]  Diabetes  Hypertension   Glenn Washington is a 60 year old man with history of DM2, HTN, HLD, obesity, GERD presenting for follow up of DM2.  Metformin 4 tablets daily, 28u of insulin and 2 glipizide. Occasionally misses doses of insulin. No episodes of hypoglycemia. He has dietary indiscretions - drinks regular soda and eats a lot of fried chicken.  Taking HCTZ, lisinopril, and metoprolol.   Review of Systems Constitutional: no fevers/chills Ears, nose, mouth, throat, and face: no cough Respiratory: no shortness of breath Cardiovascular: no chest pain Gastrointestinal: no nausea/vomiting, no abdominal pain, no constipation, no diarrhea  Past Medical History  Diagnosis Date  . Diabetes mellitus   . Hypertension   . Hyperlipidemia   . Morbid obesity ([facility])   . Constipation   . History of chest pain 2002  . GERD (gastroesophageal reflux disease)     Current Outpatient Prescriptions on File Prior to Visit  Medication Sig Dispense Refill  . aspirin 81 MG tablet Take 81 mg by mouth daily.      Marland Kitchen. atorvastatin (LIPITOR) 40 MG tablet Take 1 tablet (40 mg total) by mouth daily. 30 tablet 1  . Blood Glucose Monitoring Suppl (TRUE TRACK BLOOD GLUCOSE) DEVI 1 each by Does not apply route 2 (two) times daily.      Marland Kitchen. glipiZIDE (GLUCOTROL XL) 10 MG 24 hr tablet Take 2 tablets (20 mg total) by mouth daily. 60 tablet 3  . glucose blood test strip 1 each by Other route as needed. Use as instructed     . hydrochlorothiazide (HYDRODIURIL) 25 MG tablet Take 1 tablet (25 mg total) by mouth daily. 30 tablet 11  . Insulin Glargine (LANTUS SOLOSTAR) 100 UNIT/ML Solostar Pen Inject 28 Units into the skin daily. 45 mL 0  . Insulin Pen Needle 31G X 5 MM MISC Use once daily. 100 each 1  . LANCETS ULTRA THIN 30G MISC 1 each by Does not apply route 2 (two) times daily.      Marland Kitchen. lisinopril (PRINIVIL,ZESTRIL) 40 MG  tablet Take 1 tablet (40 mg total) by mouth daily. 90 tablet 3  . metFORMIN (GLUCOPHAGE-XR) 500 MG 24 hr tablet Take 4 tablets (2,000 mg total) by mouth daily with breakfast. 360 tablet 2  . metoprolol tartrate (LOPRESSOR) 25 MG tablet Take 1 tablet (25 mg total) by mouth 2 (two) times daily. 60 tablet 3  . NIFEdipine (PROCARDIA XL) 60 MG 24 hr tablet Take 1 tablet (60 mg total) by mouth daily. 30 tablet 3  . omeprazole (PRILOSEC) 40 MG capsule Take 1 capsule (40 mg total) by mouth daily before breakfast. 90 capsule 3  . polyethylene glycol (MIRALAX / GLYCOLAX) packet Take 17 g by mouth daily. (Patient not taking: Reported on 01/13/2015) 14 each 1   No current facility-administered medications on file prior to visit.    Objective:   Physical Exam Blood pressure 136/96, pulse 81, temperature 98.4 F (36.9 C), temperature source Oral, height 6' (1.829 m), weight 267 lb (121.11 kg), SpO2 99 %. General Apperance: NAD HEENT: Normocephalic, atraumatic, anicteric sclera Neck: Supple, trachea midline Lungs: Clear to auscultation bilaterally. No wheezes, rhonchi or rales. Breathing comfortably Heart: Regular rate and rhythm, no murmur/rub/gallop Abdomen: Soft, nontender, nondistended, no rebound/guarding Extremities: Warm and well perfused, no edema Skin: No rashes or lesions Neurologic: Alert and interactive. No gross deficits.    Assessment &  Plan:  Please refer to problem based charting.

## 2015-08-09 NOTE — Progress Notes (Signed)
  Medical Nutrition Therapy:  Appt start time: 0930 end time:  1001. Visit # 2  Assessment:  Primary concerns today: glycemic control Tahjai's speech is somewhat tangential making it difficult to assist him with making plans for improving his blood sugars. He says his family buys him food and he finds it hard to say no or ask them for healthier options. He describes a sister as trying to help him to make healthier choices as annoying. "I know she is right."  He get >100$ food stamps per month and feels that if he spent it at one time on healthy foods, that he wouldn't go back and spend the rest on snack/junk foods, but he was not ready to commit to a change. He does not like to cook as often. He is not interested in diabetes medicine to decrease his appetite.   Preferred Learning Style:No preference indicated  Learning Readiness: Contemplating  LABS: A1c- increased to 12.5% today, he did not bring his meter ANTHROPOMETRICS: weight-267##, BMI-36 class II obesity   WEIGHT HISTORY:stable SLEEP:need to assess at future visit MEDICATIONS: not taking diabetes medicine as prescribed one time a day which increases the possibly that he misses taking some daily  DIETARY INTAKE: Usual eating pattern includes 1-2 meals and snacks throughout the afternoon sometimes and often at night.  24-hr recall:  B ( AM): 16-32 oz whole milk, coffee cake or eggs, sausage, bacon and 2 slices toast L ( PM): fast food either [employer] 2 pice meal or [employer] or other from family members Snk ( PM):candy bars D ( PM): sometimes grilled chicken, vegetables, bread x 2 slices,  Or 2 tuna sandwiches Snk ( PM): juice and fig bars Beverages: beer on occasion, whole milk, regular and diet root beer  Usual physical activity: he walks a quarter of a milk a few times a week, pain in his calves stops him from doing more or going further.   Progress Towards Goal(s):  In progress.   Nutritional Diagnosis:  [place]-2.2 Altered  nutrition-related laboratory As related to excess sugar in his blood caused by food choices and non adherence with his diabetes medicine.  As evidenced by his report and a1C of 12.5%.    Intervention:  Nutrition education and counseling about barriers to self care. Provided patient with shopping list to assist with his stated goal of spending his food stamp money on healthy foods.  Coordination of care:  Teaching Method Utilized: Visual,,Auditory,Hands on Handouts given during visit include: Barriers to learning/adherence to lifestyle change: competing values Demonstrated degree of understanding via:  Teach Back   Monitoring/Evaluation:  Dietary intake, exercise, meter, and body weight in 6 week(s).

## 2015-08-09 NOTE — Assessment & Plan Note (Signed)
Improved to 136/96  Continue HCTZ 25mg  daily, lisinopril 40mg  daily, metoprolol tartrate 25mg  BID.

## 2015-08-09 NOTE — Telephone Encounter (Signed)
[facility] pharm calls they do not carry the glipizide ordered they only carry the ER, could you please change or you may call at [phone]

## 2015-08-09 NOTE — Patient Instructions (Addendum)
Change your insulin to 30 units daily.  Starting on Friday, increase your insulin by 2 units every week until your fasting (before breakfast) blood sugar is less than 130.  Make an appointment to talk to Glenn Washington and bring your meter  Follow up in 4-6 weeks and bring your meter

## 2015-08-10 NOTE — Patient Instructions (Signed)
[name],   It was a pleasure meeting with you! I look forward to seeing you with your meter soon.  Also maybe some news about how your grocery shopping went?   Take care,  Glenn Washington [phone]

## 2015-08-10 NOTE — Progress Notes (Signed)
Internal Medicine Clinic Attending  Case discussed with Dr. Krall at the time of the visit.  We reviewed the resident's history and exam and pertinent patient test results.  I agree with the assessment, diagnosis, and plan of care documented in the resident's note.  

## 2015-09-09 ENCOUNTER — Telehealth: Payer: Self-pay | Admitting: Dietician

## 2015-09-09 NOTE — Telephone Encounter (Signed)
Patient had mentioned he would like to be seen very month and doctor wanted to see him back 4-6 weeks. Calling to remind him to schedule appointments and follow up on his diabetes self care. Unable to leave a message.

## 2015-09-13 NOTE — Telephone Encounter (Signed)
Spoke with the patient and sch an appt with you on 09/20/2015.

## 2015-09-20 ENCOUNTER — Encounter: Payer: Self-pay | Admitting: Dietician

## 2015-09-20 ENCOUNTER — Ambulatory Visit (INDEPENDENT_AMBULATORY_CARE_PROVIDER_SITE_OTHER): Payer: Self-pay | Admitting: Dietician

## 2015-09-20 ENCOUNTER — Encounter: Payer: Self-pay | Admitting: Internal Medicine

## 2015-09-20 ENCOUNTER — Ambulatory Visit (INDEPENDENT_AMBULATORY_CARE_PROVIDER_SITE_OTHER): Payer: Self-pay | Admitting: Internal Medicine

## 2015-09-20 VITALS — BP 159/97 | HR 94 | Temp 98.5°F | Wt 272.6 lb

## 2015-09-20 DIAGNOSIS — Z79899 Other long term (current) drug therapy: Secondary | ICD-10-CM

## 2015-09-20 DIAGNOSIS — IMO0002 Reserved for concepts with insufficient information to code with codable children: Secondary | ICD-10-CM

## 2015-09-20 DIAGNOSIS — Z713 Dietary counseling and surveillance: Secondary | ICD-10-CM

## 2015-09-20 DIAGNOSIS — Z87891 Personal history of nicotine dependence: Secondary | ICD-10-CM

## 2015-09-20 DIAGNOSIS — L608 Other nail disorders: Secondary | ICD-10-CM

## 2015-09-20 DIAGNOSIS — E1165 Type 2 diabetes mellitus with hyperglycemia: Secondary | ICD-10-CM

## 2015-09-20 DIAGNOSIS — I10 Essential (primary) hypertension: Secondary | ICD-10-CM

## 2015-09-20 DIAGNOSIS — E1129 Type 2 diabetes mellitus with other diabetic kidney complication: Secondary | ICD-10-CM

## 2015-09-20 DIAGNOSIS — Z794 Long term (current) use of insulin: Secondary | ICD-10-CM

## 2015-09-20 MED ORDER — INSULIN GLARGINE 100 UNIT/ML SOLOSTAR PEN: 32.0000 [IU] | PEN_INJECTOR | Freq: Every day | SUBCUTANEOUS | 2 refills | Status: DC

## 2015-09-20 NOTE — Progress Notes (Signed)
Internal Medicine Clinic Attending  Case discussed with Dr. Wallace at the time of the visit.  We reviewed the resident's history and exam and pertinent patient test results.  I agree with the assessment, diagnosis, and plan of care documented in the resident's note.  

## 2015-09-20 NOTE — Assessment & Plan Note (Signed)
BP Readings from Last 3 Encounters:  09/20/15 (!) 159/97  09/20/15 (!) 159/97  08/09/15 (!) 136/96   A:  Patient here today for DM and HTN follow up.  BP today is elevated.  However, patient did not take any of his medications this morning.  P: - will not make any changes today. - encouraged patient to take BP meds daily even on days he goes to the doctor. - encouraged diet and exercise with decreased salt intake. - RTC 2 weeks.

## 2015-09-20 NOTE — Patient Instructions (Signed)
Please take all of your medicine as usual before next week's appointment.  Good job walking and making healthier food choices! It is helping your blood sugars a lot!  Increase your Lantus as instructed by Dr. Isabella Bowens if okay with Dr. Earlene Plater today.    Dr. Isabella Bowens wanted your to increase your lantus to 32 units then increase by 2 units every week until your first blood sugar of the day before eating or drinking is less than 130.

## 2015-09-20 NOTE — Assessment & Plan Note (Signed)
Lab Results  Component Value Date   HGBA1C 12.5 08/09/2015   A: Patient increased his Lantus to 30 units but did not continue to increase by 2 units per week until fasting CBG less than 130.  He continues to take his other medications as prescribed.  He did not bring a meter but states fasting sugars are around 180-200.  He met with Debera Lat who wants patient to return in 1-2 weeks.  P: - increase Lantus to 32 units daily.  Instructed patient to increase insulin by 2 units every week until fasting is less than 130. - continue metformin 2077m daily and glipizide 274mdaily. - encouraged dietary discretion. - reminded patient to bring meter to every visit. - placed referral today for podiatry to be [facility] due to his long, thickened toenails and inability to provide his own foot care. - reminded patient he is due for eye exam and [facility]. - foot exam done today. - RTC 2 weeks.

## 2015-09-20 NOTE — Progress Notes (Signed)
   CC: follow up for DM  HPI:  [name] is a 60 y.o. man with PMHx as below who is presenting to [facility] today for follow up of his diabetes and HTN.  Please see A&P for further details regarding medical conditions addressed today.  Past Medical History:  Diagnosis Date  . Constipation   . Diabetes mellitus   . GERD (gastroesophageal reflux disease)   . History of chest pain 2002  . Hyperlipidemia   . Hypertension   . Morbid obesity ([place])     Review of Systems:   Review of Systems  Constitutional: Negative for chills and fever.  Respiratory: Negative for shortness of breath.   Cardiovascular: Negative for chest pain.  Skin: Negative for rash.     Physical Exam:  Vitals:   09/20/15 0901  BP: (!) 159/97  Pulse: 94  Temp: 98.5 F (36.9 C)  TempSrc: Oral  SpO2: 97%  Weight: 272 lb 9.6 oz (123.7 kg)   Physical Exam  Constitutional: He is well-developed, well-nourished, and in no distress.  HENT:  Head: Normocephalic and atraumatic.  Eyes: EOM are normal.  Pulmonary/Chest: Effort normal.  Musculoskeletal: He exhibits no edema.  Toenails are thickened and overgrown with evidence of onychomycosis.  No wounds or ulcers.  Pulses diminished at [facility] and PT  Skin: Skin is warm and dry.  Psychiatric: Mood and affect normal.    Assessment & Plan:   See Encounters Tab for problem based charting.   Patient discussed with Dr. Angelia Mould   Essential hypertension BP Readings from Last 3 Encounters:  09/20/15 (!) 159/97  09/20/15 (!) 159/97  08/09/15 (!) 136/96   A:  Patient here today for DM and HTN follow up.  BP today is elevated.  However, patient did not take any of his medications this morning.  P: - will not make any changes today. - encouraged patient to take BP meds daily even on days he goes to the doctor. - encouraged diet and exercise with decreased salt intake. - RTC 2 weeks.  Diabetes mellitus type 2, uncontrolled ([place]) Lab Results  Component  Value Date   HGBA1C 12.5 08/09/2015   A: Patient increased his Lantus to 30 units but did not continue to increase by 2 units per week until fasting CBG less than 130.  He continues to take his other medications as prescribed.  He did not bring a meter but states fasting sugars are around 180-200.  He met with Debera Lat who wants patient to return in 1-2 weeks.  P: - increase Lantus to 32 units daily.  Instructed patient to increase insulin by 2 units every week until fasting is less than 130. - continue metformin '2000mg'$  daily and glipizide '20mg'$  daily. - encouraged dietary discretion. - reminded patient to bring meter to every visit. - placed referral today for podiatry to be seen in the foot clinic for [facility] due to his long, thickened toenails and inability to provide his own foot care. - reminded patient he is due for eye exam and to call [facility]. - foot exam done today. - RTC 2 weeks.

## 2015-09-20 NOTE — Patient Instructions (Signed)
Change your insulin to 32 units daily starting now.  Then, starting on Friday, increase your insulin by 2 units every week until your fasting (before breakfast) blood sugar is less than 130.  Make an appointment to talk to Lupita Leash and bring your meter in the next 1-2 weeks.  Follow up in 1-2 weeks and bring your meter.  Please make an appointment with [facility] at [phone] to have your annual eye exam.  I have also placed a referral for you to be seen by a foot doctor.

## 2015-09-20 NOTE — Progress Notes (Signed)
  Medical Nutrition Therapy:  Appt start time: 0815 end time:  0900. Visit # 3  Assessment:  Primary concerns today: glycemic control Glenn Washington is here for diabetes self management follow up.  He did not bring his meter and asks for pen needles. Says they cost 41$ and he cannot afford them. He reports he is happy with his blood sugars recently and wants his A1C to be an "8" in September. He reports broken sleep with daytime naps has been going on for years. He could not identify additional changes in food or activity that he is willing to make to lower his blood sugars more to achieve his A1C goal.   LABS: last A1c- 12.5% , CBG today 183 on office meter,reports fasting blood sugars 180-200 range ANTHROPOMETRICS: weight-272.##, BMI-36 class II obesity   WEIGHT HISTORY:stable SLEEP:naps 3-5 PM, goes to bed 1030 Pm falls asleep 1130 PM, awakens from 2-4AM, then goes back to sleep from 4 to 9AM MEDICATIONS: taking diabetes medicine as prescribed; did not increase the lantus as directed. may miss lantus one time a week but never the pills  DIETARY INTAKE: Usual eating pattern includes 1-2 meals and snacks throughout the afternoon sometimes and often at night.  24-hr recall:  B ( AM): broccoli, [place] lunch meat L ( PM): [place] fried fish, a few fries and hush puppies Snk ( PM):werther's on occassion D ( PM): boiled  chicken, vegetables, rice 3/4 cup, 4 snall dinner rolls with margarine Snk ( PM): sugar free juice and fig bars Beverages: beer 36 ounces 3 times a week, whole milk, diet cranberry juice  Usual physical activity: he walks 8-16 blocks 5 days a week.   Progress Towards Goal(s):  In progress.   Nutritional Diagnosis:  [place]-2.2 Altered nutrition-related laboratory As related to excess sugar in his blood caused by food choices and non adherence with his diabetes medicine is improving  As evidenced by his report of lower fasting blood sugars.    Intervention:  Nutrition education and  counseling about sleep hygiene, what numbers on his meter need to be to achieve his A1C goal. Provided 20-25 pen needles and coupon for Unifine pen needles.  Coordination of care: Discussed follow up with Dr. Earlene Plater Teaching Method Utilized: Visual,,Auditory,Hands on Handouts given during visit include: Barriers to learning/adherence to lifestyle change: competing values Demonstrated degree of understanding via:  Teach Back   Monitoring/Evaluation:  Dietary intake, exercise, meter, and body weight in 2 week(s).

## 2015-09-26 ENCOUNTER — Ambulatory Visit: Payer: Self-pay | Admitting: Dietician

## 2015-10-03 ENCOUNTER — Ambulatory Visit: Payer: Self-pay | Admitting: Dietician

## 2015-10-10 ENCOUNTER — Ambulatory Visit: Payer: Self-pay

## 2015-10-14 ENCOUNTER — Ambulatory Visit: Payer: Self-pay

## 2015-10-17 ENCOUNTER — Ambulatory Visit: Payer: Self-pay

## 2015-10-17 ENCOUNTER — Other Ambulatory Visit: Payer: Self-pay

## 2015-10-17 MED ORDER — INSULIN PEN NEEDLE 31G X 5 MM MISC: 1 refills | Status: DC

## 2015-10-17 NOTE — Telephone Encounter (Signed)
Requesting insulin pen needle to be filled @ health department.

## 2015-10-19 ENCOUNTER — Telehealth: Payer: Self-pay | Admitting: *Deleted

## 2015-10-19 NOTE — Telephone Encounter (Signed)
Call from [facility] confirming dose of pt's Lantus- per pt's last visit he is to increase Lantus to 32 units daily.  Instructed patient to increase insulin by 2 units every week until fasting is less than 130. Phone call complete.Kingsley SpittleGoldston, Domini Vandehei Cassady8/23/201710:27 AM

## 2015-10-25 ENCOUNTER — Ambulatory Visit: Payer: Self-pay | Admitting: Dietician

## 2015-11-03 ENCOUNTER — Telehealth: Payer: Self-pay | Admitting: Pulmonary Disease

## 2015-11-03 DIAGNOSIS — E1129 Type 2 diabetes mellitus with other diabetic kidney complication: Secondary | ICD-10-CM

## 2015-11-03 DIAGNOSIS — E1165 Type 2 diabetes mellitus with hyperglycemia: Principal | ICD-10-CM

## 2015-11-03 DIAGNOSIS — IMO0002 Reserved for concepts with insufficient information to code with codable children: Secondary | ICD-10-CM

## 2015-11-03 NOTE — Telephone Encounter (Signed)
Pt requesting his yearly Eye Exam from [facility].  Please advise.

## 2015-12-20 ENCOUNTER — Telehealth: Payer: Self-pay | Admitting: Pulmonary Disease

## 2015-12-20 NOTE — Telephone Encounter (Signed)
APT. REMINDER CALL, NO ANSWER, NO VOICEMAIL °

## 2015-12-21 ENCOUNTER — Ambulatory Visit (INDEPENDENT_AMBULATORY_CARE_PROVIDER_SITE_OTHER): Payer: Self-pay | Admitting: Pulmonary Disease

## 2015-12-21 ENCOUNTER — Ambulatory Visit (HOSPITAL_COMMUNITY)
Admission: RE | Admit: 2015-12-21 | Discharge: 2015-12-21 | Disposition: A | Discharge status: Home / Self Care | Payer: Self-pay | Source: Ambulatory Visit | Attending: Internal Medicine | Admitting: Internal Medicine

## 2015-12-21 ENCOUNTER — Encounter: Payer: Self-pay | Admitting: Pulmonary Disease

## 2015-12-21 VITALS — BP 157/83 | HR 84 | Temp 97.8°F | Ht 72.0 in | Wt 280.7 lb

## 2015-12-21 DIAGNOSIS — I259 Chronic ischemic heart disease, unspecified: Secondary | ICD-10-CM

## 2015-12-21 DIAGNOSIS — E1129 Type 2 diabetes mellitus with other diabetic kidney complication: Secondary | ICD-10-CM

## 2015-12-21 DIAGNOSIS — I208 Other forms of angina pectoris: Secondary | ICD-10-CM | POA: Insufficient documentation

## 2015-12-21 DIAGNOSIS — I10 Essential (primary) hypertension: Secondary | ICD-10-CM

## 2015-12-21 DIAGNOSIS — R000 Tachycardia, unspecified: Secondary | ICD-10-CM | POA: Insufficient documentation

## 2015-12-21 DIAGNOSIS — Z794 Long term (current) use of insulin: Secondary | ICD-10-CM

## 2015-12-21 DIAGNOSIS — Z87891 Personal history of nicotine dependence: Secondary | ICD-10-CM

## 2015-12-21 DIAGNOSIS — Z79899 Other long term (current) drug therapy: Secondary | ICD-10-CM

## 2015-12-21 DIAGNOSIS — R002 Palpitations: Secondary | ICD-10-CM

## 2015-12-21 DIAGNOSIS — E1165 Type 2 diabetes mellitus with hyperglycemia: Secondary | ICD-10-CM

## 2015-12-21 DIAGNOSIS — IMO0002 Reserved for concepts with insufficient information to code with codable children: Secondary | ICD-10-CM

## 2015-12-21 DIAGNOSIS — Z23 Encounter for immunization: Secondary | ICD-10-CM

## 2015-12-21 DIAGNOSIS — Z0000 Encounter for general adult medical examination without abnormal findings: Secondary | ICD-10-CM

## 2015-12-21 LAB — GLUCOSE, CAPILLARY: GLUCOSE-CAPILLARY: 253 mg/dL — AB (ref 65–99)

## 2015-12-21 LAB — POCT GLYCOSYLATED HEMOGLOBIN (HGB A1C): Hemoglobin A1C: 9.8

## 2015-12-21 MED ORDER — HYDROCHLOROTHIAZIDE 25 MG PO TABS: 25.0000 mg | ORAL_TABLET | Freq: Every day | ORAL | 3 refills | Status: DC

## 2015-12-21 MED ORDER — GLIPIZIDE ER 10 MG PO TB24: 20.0000 mg | ORAL_TABLET | Freq: Every day | ORAL | 11 refills | Status: DC

## 2015-12-21 MED ORDER — LISINOPRIL 40 MG PO TABS: 40.0000 mg | ORAL_TABLET | Freq: Every day | ORAL | 3 refills | Status: DC

## 2015-12-21 MED ORDER — INSULIN GLARGINE 100 UNIT/ML SOLOSTAR PEN: 34.0000 [IU] | PEN_INJECTOR | Freq: Every day | SUBCUTANEOUS | 2 refills | Status: DC

## 2015-12-21 MED ORDER — ATORVASTATIN CALCIUM 40 MG PO TABS: 40.0000 mg | ORAL_TABLET | Freq: Every day | ORAL | 3 refills | Status: DC

## 2015-12-21 MED ORDER — METFORMIN HCL ER 500 MG PO TB24: 2000.0000 mg | ORAL_TABLET | Freq: Every day | ORAL | 11 refills | Status: DC

## 2015-12-21 MED ORDER — METOPROLOL TARTRATE 25 MG PO TABS: 25.0000 mg | ORAL_TABLET | Freq: Two times a day (BID) | ORAL | 3 refills | Status: DC

## 2015-12-21 MED ORDER — METOPROLOL TARTRATE 50 MG PO TABS: 50.0000 mg | ORAL_TABLET | Freq: Two times a day (BID) | ORAL | 3 refills | Status: DC

## 2015-12-21 NOTE — Patient Instructions (Signed)
Please take your Lantus in the morning with the rest of your medications. Increase your Lantus to 34 units. Please work on increasing your exercise. Follow up in 3 months.

## 2015-12-21 NOTE — Assessment & Plan Note (Signed)
Influenza vaccine administered

## 2015-12-21 NOTE — Assessment & Plan Note (Signed)
Assessment: BP 145/98 which is still not controlled.  Plan:  Continue HCTZ 25mg  daily, lisinopril 40mg  daily. Increase metoprolol to 50mg  BID.

## 2015-12-21 NOTE — Assessment & Plan Note (Signed)
Assessment: His A1c has improved from 12.5 to 9.8. He is more compliant of his oral medications.   Plan: Continue metformin 2000mg  daily Continue glipizide 20mg  daily Change Lantus to every morning with the restof his morning medications. Increase Lantus to 34 units daily. Follow up in 3 months.

## 2015-12-21 NOTE — Progress Notes (Signed)
   CC: Heart beats fast with walking  HPI:  [name] is a 60 y.o. with medical history as noted below presenting with exertional tachycardia.  He notices that his heart beats fast when he walks about 3 blocks. With rest it improves. He denies chest tightness or pressure. Denies diaphoresis or nausea. Does not get chest discomfort at rest.   He is taking Lantus 30units daily in the afternoon. He is taking metformin and glipizide. He occasionally misses a couple of doses of Lantus in the last couple of weeks. Lowest blood sugar he has measured has been 193.  He took the lisinopril this morning but not the HCTZ.  Past Medical History:  Diagnosis Date  . Constipation   . Diabetes mellitus   . GERD (gastroesophageal reflux disease)   . History of chest pain 2002  . Hyperlipidemia   . Hypertension   . Morbid obesity (HCC)     Review of Systems:   Constitutional: no fevers/chills Respiratory: no shortness of breath Gastrointestinal: no nausea/vomiting, no abdominal pain, no constipation, no diarrhea  Physical Exam:  Vitals:   12/21/15 0839  BP: (!) 145/98  Pulse: 90  Temp: 97.8 F (36.6 C)  TempSrc: Oral  SpO2: 99%  Weight: 280 lb 11.2 oz (127.3 kg)  Height: 6' (1.829 m)   General Apperance: NAD HEENT: Normocephalic, atraumatic, anicteric sclera Neck: Supple, trachea midline Lungs: Clear to auscultation bilaterally. No wheezes, rhonchi or rales. Breathing comfortably Heart: Regular rate and rhythm Abdomen: Soft, nontender, nondistended, no rebound/guarding Extremities: Warm and well perfused, no edema Skin: No rashes or lesions Neurologic: Alert and interactive. No gross deficits.   I independently reviewed his EKG. Normal sinus rhythm. Q waves in inferior leads and T wave inversions in anterior leads. Some early repolarization. No previous EKG for comparison.  Assessment & Plan:   See Encounters Tab for problem based charting.  Patient discussed with Dr.  Oswaldo DoneVincent

## 2015-12-21 NOTE — Assessment & Plan Note (Signed)
Assessment: He feels that his heart beats rapidly after walking about 3 blocks. Denies anginal pain. EKG with no acute ischemic changes. He does have some evidence of ischemic heart disease.  Plan: Continue medical management of ischemic heart disease including ASA, statin Consider ambulatory monitoring if his symptoms worsen

## 2015-12-22 ENCOUNTER — Other Ambulatory Visit: Payer: Self-pay

## 2015-12-22 NOTE — Progress Notes (Signed)
Internal Medicine Clinic Attending  Case discussed with Dr. Krall at the time of the visit.  We reviewed the resident's history and exam and pertinent patient test results.  I agree with the assessment, diagnosis, and plan of care documented in the resident's note.  

## 2015-12-22 NOTE — Telephone Encounter (Signed)
Script called to pharm

## 2015-12-22 NOTE — Telephone Encounter (Signed)
Tried to call pt, no answer and message states there is not enough space to leave a message

## 2015-12-22 NOTE — Telephone Encounter (Signed)
Insulin Glargine (LANTUS SOLOSTAR) 100 UNIT/ML Solostar Pen, refill request @ [facility]

## 2015-12-23 ENCOUNTER — Other Ambulatory Visit: Payer: Self-pay | Admitting: *Deleted

## 2015-12-23 NOTE — Telephone Encounter (Signed)
Do they have the generic glipizide extended release? If they do not can you check with Mr. Gaynelle AduMcCain to see if he is willing to take it twice a day? Thanks!

## 2015-12-23 NOTE — Telephone Encounter (Signed)
[facility] - they do not have Glucotrol XL. They do have Glipizide 10 mg - if u decide to change, they need new rx and directions; send electronic. Thanks

## 2015-12-26 NOTE — Telephone Encounter (Signed)
[facility] pharmacy does not have generic Glucotrol XL. Called pt - stated he gets Glipizide XL from [facility] on [address].Needs a refill sent [facility].

## 2015-12-27 ENCOUNTER — Other Ambulatory Visit: Payer: Self-pay | Admitting: *Deleted

## 2015-12-27 MED ORDER — GLIPIZIDE ER 10 MG PO TB24: 20.0000 mg | ORAL_TABLET | Freq: Every day | ORAL | 11 refills | Status: DC

## 2016-01-04 ENCOUNTER — Other Ambulatory Visit: Payer: Self-pay

## 2016-01-06 ENCOUNTER — Ambulatory Visit (INDEPENDENT_AMBULATORY_CARE_PROVIDER_SITE_OTHER): Payer: Self-pay | Admitting: Internal Medicine

## 2016-01-06 ENCOUNTER — Encounter: Payer: Self-pay | Admitting: Internal Medicine

## 2016-01-06 VITALS — BP 141/105 | HR 79 | Temp 98.2°F | Wt 279.2 lb

## 2016-01-06 DIAGNOSIS — S90221A Contusion of right lesser toe(s) with damage to nail, initial encounter: Secondary | ICD-10-CM

## 2016-01-06 DIAGNOSIS — B351 Tinea unguium: Secondary | ICD-10-CM

## 2016-01-06 DIAGNOSIS — W228XXA Striking against or struck by other objects, initial encounter: Secondary | ICD-10-CM

## 2016-01-06 DIAGNOSIS — Z87891 Personal history of nicotine dependence: Secondary | ICD-10-CM

## 2016-01-06 DIAGNOSIS — S90211A Contusion of right great toe with damage to nail, initial encounter: Secondary | ICD-10-CM

## 2016-01-06 DIAGNOSIS — K219 Gastro-esophageal reflux disease without esophagitis: Secondary | ICD-10-CM

## 2016-01-06 DIAGNOSIS — E119 Type 2 diabetes mellitus without complications: Secondary | ICD-10-CM

## 2016-01-06 DIAGNOSIS — Z79899 Other long term (current) drug therapy: Secondary | ICD-10-CM

## 2016-01-06 DIAGNOSIS — I10 Essential (primary) hypertension: Secondary | ICD-10-CM

## 2016-01-06 MED ORDER — OMEPRAZOLE 40 MG PO CPDR: 40.0000 mg | DELAYED_RELEASE_CAPSULE | Freq: Every day | ORAL | 3 refills | Status: DC

## 2016-01-06 NOTE — Assessment & Plan Note (Signed)
He has a subungal hematoma of the right big toe. No visible cuts or lesions, erythema, swelling, or pain to suggest a concomitant infection. I advised him to wear closed toe shoes with socks as much as possible and to keep an eye on his toe. Recommended that if he develops swelling, redness, pain, or the toenail falls off for him to return to clinic. Advised him that the toe should heal on his own and the discoloration will disappear. He is scheduled to see podiatry in early December so hopefully can have it evaluated by them.

## 2016-01-06 NOTE — Assessment & Plan Note (Signed)
BP remains elevated. He would benefit from ambulatory blood pressure monitoring to see if he actually does have white coat hypertension. We also discussed following a low sodium diet and avoiding certain foods (frozen dinners, fried foods, canned soup). Will continue current regimen.

## 2016-01-06 NOTE — Assessment & Plan Note (Signed)
Refilled his Prilosec for him. Recommended to decrease to every other day dosing. He could try trial of not taking medication to see if his acid reflux symptoms reoccur. May be helpful for him to have one less medication to take.

## 2016-01-06 NOTE — Patient Instructions (Signed)
General Instructions: - Keep an eye on your toe. If you develop swelling, redness, pain, or if the nail falls off before you are able to see podiatry then please return to clinic - make sure to continue wearing closed toe shoes and socks - Can consider going to every other day with your Omeprazole for acid reflux  Please bring your medicines with you each time you come to clinic.  Medicines may include prescription medications, over-the-counter medications, herbal remedies, eye drops, vitamins, or other pills.   Progress Toward Treatment Goals:  Treatment Goal 07/07/2014  Hemoglobin A1C improved  Blood pressure improved    Self Care Goals & Plans:  Self Care Goal 12/21/2015  Manage my medications take my medicines as prescribed; bring my medications to every visit; refill my medications on time  Monitor my health keep track of my blood glucose; bring my glucose meter and log to each visit; keep track of my blood pressure; check my feet daily  Eat healthy foods eat more vegetables; eat foods that are low in salt; eat baked foods instead of fried foods  Be physically active find an activity I enjoy    Home Blood Glucose Monitoring 01/14/2014  Check my blood sugar once a day  When to check my blood sugar -     Care Management & Community Referrals:  Referral 01/14/2014  Referrals made for care management support diabetes educator  Referrals made to community resources -

## 2016-01-06 NOTE — Progress Notes (Signed)
   CC: Toe discoloration  HPI:  Glenn Washington is a 60 y.o. man with PMHx as noted below who presents today for evaluation of toe discoloration.  Right Big Toe Discoloration: He reports stubbing his right big toe about 1 week ago. He noticed the nail turned dark and was concerned since he is diabetic. He denies any pain, swelling, or redness of the toe. Denies any fevers. He is scheduled to see podiatry the first week of December. He reports wearing closed toe shoes with socks most of the time, but will occasionally wear slide sandals with open toes. He denies seeing any drainage or blood in his socks.   HTN: BP elevated at 159/95. Repeat BP 141/105. He is taking HCTZ 25 mg daily, Lisinopril 40 mg daily, and Lopressor 50 mg BID. He reports being told he has white coat hypertension. He admits to eating a higher amount of salty foods recently, including frozen dinners and other "junk food."   GERD: Reports he needs a refill on his Omeprazole. Denies any acid reflux symptoms. He has not tried a trial off of the PPI but seems interested in this.   Past Medical History:  Diagnosis Date  . Constipation   . Diabetes mellitus   . GERD (gastroesophageal reflux disease)   . History of chest pain 2002  . Hyperlipidemia   . Hypertension   . Morbid obesity (HCC)     Review of Systems:  All negative except per HPI  Physical Exam:  Vitals:   01/06/16 0943  BP: (!) 159/95  Pulse: 80  Temp: 98.2 F (36.8 C)  TempSrc: Oral  SpO2: 99%  Weight: 279 lb 3.2 oz (126.6 kg)   Repeat BP 141/105  General: well-nourished man sitting up, NAD HEENT: [place]/AT, EOMI, sclera anicteric, mucus membranes moist CV: RRR, no m/g/r Pulm: CTA bilaterally, breaths non-labored Ext: His right big toe nail has dark discoloration on the bottom half of the nail consistent with a subungal hematoma. No tenderness to palpation of the toe. No swelling or erythema present. No sores present on the foot. No visible cuts or  lesions on the foot. Toenails are long and onychomycosis present. Neuro: alert and oriented x 3  Assessment & Plan:   See Encounters Tab for problem based charting.  Patient discussed with Dr. Criselda PeachesMullen

## 2016-01-10 ENCOUNTER — Other Ambulatory Visit: Payer: Self-pay

## 2016-01-10 NOTE — Progress Notes (Signed)
Internal Medicine Clinic Attending  Case discussed with Dr. Rivet soon after the resident saw the patient.  We reviewed the resident's history and exam and pertinent patient test results.  I agree with the assessment, diagnosis, and plan of care documented in the resident's note.  

## 2016-01-10 NOTE — Telephone Encounter (Signed)
atorvastatin (LIPITOR) 40 MG tablet, REFILL REQUEST @ [facility].

## 2016-01-11 NOTE — Telephone Encounter (Signed)
Call made [facility]-pt has refills on file, they also have omeprazole rx that needs to be picked up.  Attempted to contact pt at number on chart no answer @ [phone] (H) and called sister @ [phone] (M)-she will have pt call the clinic back.Glenn SpittleGoldston, Glenn Storr Cassady11/15/20173:04 PM

## 2016-01-30 ENCOUNTER — Ambulatory Visit: Payer: Self-pay | Attending: Internal Medicine | Admitting: Podiatry

## 2016-01-30 DIAGNOSIS — M79674 Pain in right toe(s): Secondary | ICD-10-CM

## 2016-01-30 DIAGNOSIS — IMO0002 Reserved for concepts with insufficient information to code with codable children: Secondary | ICD-10-CM

## 2016-01-30 DIAGNOSIS — M79675 Pain in left toe(s): Secondary | ICD-10-CM

## 2016-01-30 DIAGNOSIS — B351 Tinea unguium: Secondary | ICD-10-CM

## 2016-01-30 DIAGNOSIS — E1165 Type 2 diabetes mellitus with hyperglycemia: Secondary | ICD-10-CM

## 2016-01-30 DIAGNOSIS — E1129 Type 2 diabetes mellitus with other diabetic kidney complication: Secondary | ICD-10-CM

## 2016-01-30 NOTE — Patient Instructions (Signed)

## 2016-01-30 NOTE — Progress Notes (Signed)
Subjective:     Patient ID: Glenn Washington, male   DOB: 03/10/1955, 60 y.o.   MRN: [id number]  HPI 60 year old male presents the office today for concerns of thick, painful, elongated toenails that he cannot trim himsel He states that his right big toenail fell off on Thanksgiving. Denies any drainage or redness. His been diabetic for 20+ years. He has no other complaints today. Denies any history of ulceration.  Last A1c 9.8  Review of Systems     Objective:   Physical Exam General: AAO x3, NAD  Dermatological: Nails are hypertrophic, dystrophic, brittle, discolored, elongated 9. No surrounding redness or drainage. Tenderness nails 1-5 bilaterally except the right hallux toenail which has fallen off. Nail bed is clean and healed. No redness, drainage, or signs of infection. Bilateral submetatarsal lesions x 3 on the right and x 2 on the left. No underlying ulceration, drainage, or sings of infectino. No open lesions or pre-ulcerative lesions are identified today.  Vascular: Dorsalis Pedis artery and Posterior Tibial artery pedal pulses are 2/4 bilateral with immedate capillary fill time. There is no pain with calf compression, swelling, warmth, erythema.   Neruologic: Grossly intact via light touch bilateral. Vibratory intact via tuning fork bilateral. Protective threshold with Semmes Wienstein monofilament intact to all pedal sites bilateral.  Musculoskeletal: No gross boney pedal deformities bilateral. No pain, crepitus, or limitation noted with foot and ankle range of motion bilateral. Muscular strength 5/5 in all groups tested bilateral.  Gait: Unassisted, Nonantalgic.      Assessment:     Symptomatic onychomycosis    Plan:     -Treatment options discussed including all alternatives, risks, and complications -Etiology of symptoms were discussed -Nails debrided 9 without complications or bleeding. -Continue to keep the right hallux nail bed clean. Monitor for infection.  -Daily  foot inspection -Follow-up in 3 months or sooner if any problems arise. In the meantime, encouraged to call the office with any questions, concerns, change in symptoms.   Glenn Washington, DPM

## 2016-02-02 ENCOUNTER — Telehealth: Payer: Self-pay | Admitting: Pulmonary Disease

## 2016-02-02 NOTE — Telephone Encounter (Signed)
CALLED PT, HE NEEDS TO RENEW GCCN, NO VOICEMAIL

## 2016-03-28 ENCOUNTER — Telehealth: Payer: Self-pay | Admitting: Pulmonary Disease

## 2016-03-28 NOTE — Telephone Encounter (Signed)
APT. REMINDER CALL, NO ANSWER, MAILBOX FULL °

## 2016-03-29 ENCOUNTER — Ambulatory Visit (INDEPENDENT_AMBULATORY_CARE_PROVIDER_SITE_OTHER): Payer: Self-pay | Admitting: Pulmonary Disease

## 2016-03-29 ENCOUNTER — Encounter: Payer: Self-pay | Admitting: Pulmonary Disease

## 2016-03-29 VITALS — BP 132/82 | HR 86 | Temp 98.1°F | Ht 72.0 in | Wt 274.9 lb

## 2016-03-29 DIAGNOSIS — E1129 Type 2 diabetes mellitus with other diabetic kidney complication: Secondary | ICD-10-CM

## 2016-03-29 DIAGNOSIS — Z87891 Personal history of nicotine dependence: Secondary | ICD-10-CM

## 2016-03-29 DIAGNOSIS — Z794 Long term (current) use of insulin: Secondary | ICD-10-CM

## 2016-03-29 DIAGNOSIS — IMO0002 Reserved for concepts with insufficient information to code with codable children: Secondary | ICD-10-CM

## 2016-03-29 DIAGNOSIS — Z79899 Other long term (current) drug therapy: Secondary | ICD-10-CM

## 2016-03-29 DIAGNOSIS — I10 Essential (primary) hypertension: Secondary | ICD-10-CM

## 2016-03-29 DIAGNOSIS — E1165 Type 2 diabetes mellitus with hyperglycemia: Secondary | ICD-10-CM

## 2016-03-29 LAB — POCT GLYCOSYLATED HEMOGLOBIN (HGB A1C): Hemoglobin A1C: 9.7

## 2016-03-29 LAB — GLUCOSE, CAPILLARY: Glucose-Capillary: 135 mg/dL — ABNORMAL HIGH (ref 65–99)

## 2016-03-29 MED ORDER — EMPAGLIFLOZIN 10 MG PO TABS: 10.0000 mg | ORAL_TABLET | Freq: Every day | ORAL | 2 refills | Status: DC

## 2016-03-29 NOTE — Progress Notes (Signed)
   CC: Diabetes follow up  HPI:  [name] is a 61 year old man with history of DM, HTN, HLD presenting for follow up of his diabetes.   His right large toe is improved.   He has stopped taking his Prilosec   No low blood sugars. He takes Lantus 34u daily.    Past Medical History:  Diagnosis Date  . Constipation   . Diabetes mellitus   . GERD (gastroesophageal reflux disease)   . History of chest pain 2002  . Hyperlipidemia   . Hypertension   . Morbid obesity (HCC)     Review of Systems:   No chest pain  No dyspnea  Physical Exam:  Vitals:   03/29/16 1434 03/29/16 1501  BP: (!) 145/96 132/82  Pulse: 84 86  Temp: 98.1 F (36.7 C)   TempSrc: Oral   SpO2: 99%   Weight: 274 lb 14.4 oz (124.7 kg)   Height: 6' (1.829 m)    General Apperance: NAD HEENT: Normocephalic, atraumatic, anicteric sclera Neck: Supple, trachea midline Lungs: Clear to auscultation bilaterally. No wheezes, rhonchi or rales. Breathing comfortably Heart: Regular rate and rhythm, no murmur/rub/gallop Abdomen: Soft, nontender, nondistended, no rebound/guarding Extremities: Warm and well perfused, no edema Skin: No rashes or lesions Neurologic: Alert and interactive. No gross deficits.   Assessment & Plan:   See Encounters Tab for problem based charting.  Patient discussed with Dr. Cyndie ChimeGranfortuna

## 2016-03-29 NOTE — Patient Instructions (Addendum)
Start taking Jardiance (empagliflozin) once a day for your diabetes. Follow up in 6 weeks with your meter

## 2016-03-30 LAB — BMP8+ANION GAP
ANION GAP: 15 mmol/L (ref 10.0–18.0)
BUN/Creatinine Ratio: 23 (ref 10–24)
BUN: 20 mg/dL (ref 8–27)
CALCIUM: 10.3 mg/dL — AB (ref 8.6–10.2)
CO2: 25 mmol/L (ref 18–29)
Chloride: 99 mmol/L (ref 96–106)
Creatinine, Ser: 0.88 mg/dL (ref 0.76–1.27)
GFR calc Af Amer: 106 mL/min/{1.73_m2} (ref >59)
GFR, EST NON AFRICAN AMERICAN: 92 mL/min/{1.73_m2} (ref >59)
Glucose: 112 mg/dL — ABNORMAL HIGH (ref 65–99)
Potassium: 4.5 mmol/L (ref 3.5–5.2)
Sodium: 139 mmol/L (ref 134–144)

## 2016-03-30 NOTE — Progress Notes (Signed)
Medicine attending: Medical history, presenting problems, physical findings, and medications, reviewed with resident physician Dr Jennifer Krall on the day of the patient visit and I concur with her evaluation and management plan. 

## 2016-03-30 NOTE — Assessment & Plan Note (Addendum)
Assessment: BP controlled today at 132/82. BMP with normal renal function.  Plan: Continue lisinopril 40mg  daily, metoprolol tartrate 50mg  BID, and HCTZ 25mg  daily

## 2016-03-30 NOTE — Assessment & Plan Note (Signed)
Assessment: Remains uncontrolled with A1c 9.7%  Plan: Continue Lantus 34u daily Continue metformin 2000mg  daily Continue glipizide 20mg  daily Start empagliflozin 10mg  daily Follow up in 6-8 weeks with meter

## 2016-04-11 ENCOUNTER — Other Ambulatory Visit: Payer: Self-pay

## 2016-04-12 ENCOUNTER — Ambulatory Visit: Payer: Self-pay

## 2016-04-18 ENCOUNTER — Ambulatory Visit: Payer: Self-pay

## 2016-05-04 ENCOUNTER — Other Ambulatory Visit: Payer: Self-pay | Admitting: *Deleted

## 2016-05-04 DIAGNOSIS — E1165 Type 2 diabetes mellitus with hyperglycemia: Principal | ICD-10-CM

## 2016-05-04 DIAGNOSIS — E1129 Type 2 diabetes mellitus with other diabetic kidney complication: Secondary | ICD-10-CM

## 2016-05-04 DIAGNOSIS — IMO0002 Reserved for concepts with insufficient information to code with codable children: Secondary | ICD-10-CM

## 2016-05-04 MED ORDER — INSULIN GLARGINE 100 UNIT/ML SOLOSTAR PEN: 34.0000 [IU] | PEN_INJECTOR | Freq: Every day | SUBCUTANEOUS | 2 refills | Status: DC

## 2016-05-10 ENCOUNTER — Encounter: Payer: Self-pay | Admitting: Pulmonary Disease

## 2016-05-10 ENCOUNTER — Ambulatory Visit (INDEPENDENT_AMBULATORY_CARE_PROVIDER_SITE_OTHER): Payer: Self-pay | Admitting: Pulmonary Disease

## 2016-05-10 DIAGNOSIS — IMO0002 Reserved for concepts with insufficient information to code with codable children: Secondary | ICD-10-CM

## 2016-05-10 DIAGNOSIS — Z794 Long term (current) use of insulin: Secondary | ICD-10-CM

## 2016-05-10 DIAGNOSIS — Z87891 Personal history of nicotine dependence: Secondary | ICD-10-CM

## 2016-05-10 DIAGNOSIS — E1165 Type 2 diabetes mellitus with hyperglycemia: Secondary | ICD-10-CM

## 2016-05-10 DIAGNOSIS — I10 Essential (primary) hypertension: Secondary | ICD-10-CM

## 2016-05-10 DIAGNOSIS — E1129 Type 2 diabetes mellitus with other diabetic kidney complication: Secondary | ICD-10-CM

## 2016-05-10 DIAGNOSIS — Z79899 Other long term (current) drug therapy: Secondary | ICD-10-CM

## 2016-05-10 LAB — GLUCOSE, CAPILLARY: Glucose-Capillary: 153 mg/dL — ABNORMAL HIGH (ref 65–99)

## 2016-05-10 NOTE — Patient Instructions (Addendum)
Please bring your meter Follow up in 6-8 weeks

## 2016-05-10 NOTE — Progress Notes (Signed)
   CC: diabetes follow up  HPI:  [name] is a 61 y.o. man with DM2, HTN, HLD presenting for follow up of his diabetes.   Missed a couple of days of blood pressure medication - lisinopril and HCTZ. He did take them today. Missed due to snow.   Started taking empagliflozin yesterday. No problems with it.    Past Medical History:  Diagnosis Date  . Constipation   . Diabetes mellitus   . GERD (gastroesophageal reflux disease)   . History of chest pain 2002  . Hyperlipidemia   . Hypertension   . Morbid obesity (HCC)     Review of Systems:   No fevers or chills No chest pain  Physical Exam:  Vitals:   05/10/16 1410 05/10/16 1435  BP: (!) 163/97 (!) 142/88  Pulse: 98   Temp: 98.2 F (36.8 C)   SpO2: 98%   Weight: 279 lb 9.6 oz (126.8 kg)   Height: 6' (1.829 m)    General Apperance: NAD HEENT: Normocephalic, atraumatic, anicteric sclera Neck: Supple, trachea midline Lungs: Clear to auscultation bilaterally. No wheezes, rhonchi or rales. Breathing comfortably Heart: Regular rate and rhythm, no murmur/rub/gallop Abdomen: Soft, nontender, nondistended, no rebound/guarding Extremities: Warm and well perfused, no edema Skin: No rashes or lesions Neurologic: Alert and interactive. No gross deficits.   Assessment & Plan:   See Encounters Tab for problem based charting.  Patient discussed with Dr. Josem KaufmannKlima

## 2016-05-11 NOTE — Assessment & Plan Note (Signed)
Assessment: BP deteriorated. 163/97 but on repeat 142/88. Missed a few days of medication.  Plan: Continue HCTZ 25mg  daily, lisinopril 40mg  daily Lopressor 50mg  BID

## 2016-05-11 NOTE — Assessment & Plan Note (Signed)
Assessment: Started empagliflozin without issue. Did not bring meter today. Random CBG 153.   Plan:  Follow up in 6-8 weeks with meter. Continue Lantus 34u daily Continue metformin XR 2000mg  daily Continue glipizide 20mg  daily Continue empagliflozin 10mg  daily

## 2016-05-12 NOTE — Progress Notes (Signed)
Case discussed with Dr. Krall soon after the resident saw the patient.  We reviewed the resident's history and exam and pertinent patient test results.  I agree with the assessment, diagnosis, and plan of care documented in the resident's note. 

## 2016-06-20 ENCOUNTER — Telehealth: Payer: Self-pay | Admitting: Pulmonary Disease

## 2016-06-20 NOTE — Telephone Encounter (Signed)
APT. REMINDER CALL, NO ANSWER, MAIL BOX IS FULL °

## 2016-06-21 ENCOUNTER — Ambulatory Visit (INDEPENDENT_AMBULATORY_CARE_PROVIDER_SITE_OTHER): Payer: Self-pay | Admitting: Internal Medicine

## 2016-06-21 VITALS — BP 129/95 | HR 74 | Temp 98.3°F | Ht 72.0 in | Wt 227.9 lb

## 2016-06-21 DIAGNOSIS — Z87891 Personal history of nicotine dependence: Secondary | ICD-10-CM

## 2016-06-21 DIAGNOSIS — I10 Essential (primary) hypertension: Secondary | ICD-10-CM

## 2016-06-21 DIAGNOSIS — E1129 Type 2 diabetes mellitus with other diabetic kidney complication: Secondary | ICD-10-CM

## 2016-06-21 DIAGNOSIS — Z79899 Other long term (current) drug therapy: Secondary | ICD-10-CM

## 2016-06-21 DIAGNOSIS — Z794 Long term (current) use of insulin: Secondary | ICD-10-CM

## 2016-06-21 DIAGNOSIS — IMO0002 Reserved for concepts with insufficient information to code with codable children: Secondary | ICD-10-CM

## 2016-06-21 DIAGNOSIS — E1165 Type 2 diabetes mellitus with hyperglycemia: Secondary | ICD-10-CM

## 2016-06-21 LAB — GLUCOSE, CAPILLARY: Glucose-Capillary: 154 mg/dL — ABNORMAL HIGH (ref 65–99)

## 2016-06-21 LAB — POCT GLYCOSYLATED HEMOGLOBIN (HGB A1C): Hemoglobin A1C: 8.5

## 2016-06-21 NOTE — Patient Instructions (Addendum)
We will not make any changes to your medications today. Try to limit your sodium intake to less than 2000 mg a day. Please follow up with your PCP in the next 2-3 months.  DASH Eating Plan DASH stands for "Dietary Approaches to Stop Hypertension." The DASH eating plan is a healthy eating plan that has been shown to reduce high blood pressure (hypertension). It may also reduce your risk for type 2 diabetes, heart disease, and stroke. The DASH eating plan may also help with weight loss. What are tips for following this plan? General guidelines   Avoid eating more than 2,300 mg (milligrams) of salt (sodium) a day. If you have hypertension, you may need to reduce your sodium intake to 1,500 mg a day.  Limit alcohol intake to no more than 1 drink a day for nonpregnant women and 2 drinks a day for men. One drink equals 12 oz of beer, 5 oz of wine, or 1 oz of hard liquor.  Work with your health care provider to maintain a healthy body weight or to lose weight. Ask what an ideal weight is for you.  Get at least 30 minutes of exercise that causes your heart to beat faster (aerobic exercise) most days of the week. Activities may include walking, swimming, or biking.  Work with your health care provider or diet and nutrition specialist (dietitian) to adjust your eating plan to your individual calorie needs. Reading food labels   Check food labels for the amount of sodium per serving. Choose foods with less than 5 percent of the Daily Value of sodium. Generally, foods with less than 300 mg of sodium per serving fit into this eating plan.  To find whole grains, look for the word "whole" as the first word in the ingredient list. Shopping   Buy products labeled as "low-sodium" or "no salt added."  Buy fresh foods. Avoid canned foods and premade or frozen meals. Cooking   Avoid adding salt when cooking. Use salt-free seasonings or herbs instead of table salt or sea salt. Check with your health care  provider or pharmacist before using salt substitutes.  Do not fry foods. Cook foods using healthy methods such as baking, boiling, grilling, and broiling instead.  Cook with heart-healthy oils, such as olive, canola, soybean, or sunflower oil. Meal planning    Eat a balanced diet that includes:  5 or more servings of fruits and vegetables each day. At each meal, try to fill half of your plate with fruits and vegetables.  Up to 6-8 servings of whole grains each day.  Less than 6 oz of lean meat, poultry, or fish each day. A 3-oz serving of meat is about the same size as a deck of cards. One egg equals 1 oz.  2 servings of low-fat dairy each day.  A serving of nuts, seeds, or beans 5 times each week.  Heart-healthy fats. Healthy fats called Omega-3 fatty acids are found in foods such as flaxseeds and coldwater fish, like sardines, salmon, and mackerel.  Limit how much you eat of the following:  Canned or prepackaged foods.  Food that is high in trans fat, such as fried foods.  Food that is high in saturated fat, such as fatty meat.  Sweets, desserts, sugary drinks, and other foods with added sugar.  Full-fat dairy products.  Do not salt foods before eating.  Try to eat at least 2 vegetarian meals each week.  Eat more home-cooked food and less restaurant, buffet, and fast  food.  When eating at a restaurant, ask that your food be prepared with less salt or no salt, if possible. What foods are recommended? The items listed may not be a complete list. Talk with your dietitian about what dietary choices are best for you. Grains  Whole-grain or whole-wheat bread. Whole-grain or whole-wheat pasta. Brown rice. Orpah Cobb. Bulgur. Whole-grain and low-sodium cereals. Pita bread. Low-fat, low-sodium crackers. Whole-wheat flour tortillas. Vegetables  Fresh or frozen vegetables (raw, steamed, roasted, or grilled). Low-sodium or reduced-sodium tomato and vegetable juice. Low-sodium  or reduced-sodium tomato sauce and tomato paste. Low-sodium or reduced-sodium canned vegetables. Fruits  All fresh, dried, or frozen fruit. Canned fruit in natural juice (without added sugar). Meat and other protein foods  Skinless chicken or [place]. Ground chicken or [place]. Pork with fat trimmed off. Fish and seafood. Egg whites. Dried beans, peas, or lentils. Unsalted nuts, nut butters, and seeds. Unsalted canned beans. Lean cuts of beef with fat trimmed off. Low-sodium, lean deli meat. Dairy  Low-fat (1%) or fat-free (skim) milk. Fat-free, low-fat, or reduced-fat cheeses. Nonfat, low-sodium ricotta or cottage cheese. Low-fat or nonfat yogurt. Low-fat, low-sodium cheese. Fats and oils  Soft margarine without trans fats. Vegetable oil. Low-fat, reduced-fat, or light mayonnaise and salad dressings (reduced-sodium). Canola, safflower, olive, soybean, and sunflower oils. Avocado. Seasoning and other foods  Herbs. Spices. Seasoning mixes without salt. Unsalted popcorn and pretzels. Fat-free sweets. What foods are not recommended? The items listed may not be a complete list. Talk with your dietitian about what dietary choices are best for you. Grains  Baked goods made with fat, such as croissants, muffins, or some breads. Dry pasta or rice meal packs. Vegetables  Creamed or fried vegetables. Vegetables in a cheese sauce. Regular canned vegetables (not low-sodium or reduced-sodium). Regular canned tomato sauce and paste (not low-sodium or reduced-sodium). Regular tomato and vegetable juice (not low-sodium or reduced-sodium). Rosita Fire. Olives. Fruits  Canned fruit in a light or heavy syrup. Fried fruit. Fruit in cream or butter sauce. Meat and other protein foods  Fatty cuts of meat. Ribs. Fried meat. Tomasa Blase. Sausage. Bologna and other processed lunch meats. Salami. Fatback. Hotdogs. Bratwurst. Salted nuts and seeds. Canned beans with added salt. Canned or smoked fish. Whole eggs or egg yolks. Chicken  or [place] with skin. Dairy  Whole or 2% milk, cream, and half-and-half. Whole or full-fat cream cheese. Whole-fat or sweetened yogurt. Full-fat cheese. Nondairy creamers. Whipped toppings. Processed cheese and cheese spreads. Fats and oils  Butter. Stick margarine. Lard. Shortening. Ghee. Bacon fat. Tropical oils, such as coconut, palm kernel, or palm oil. Seasoning and other foods  Salted popcorn and pretzels. Onion salt, garlic salt, seasoned salt, table salt, and sea salt. Worcestershire sauce. Tartar sauce. Barbecue sauce. Teriyaki sauce. Soy sauce, including reduced-sodium. Steak sauce. Canned and packaged gravies. Fish sauce. Oyster sauce. Cocktail sauce. Horseradish that you find on the shelf. Ketchup. Mustard. Meat flavorings and tenderizers. Bouillon cubes. Hot sauce and Tabasco sauce. Premade or packaged marinades. Premade or packaged taco seasonings. Relishes. Regular salad dressings. Where to find more information:  National Heart, Lung, and Blood Institute: PopSteam.is  American Heart Association: www.heart.org Summary  The DASH eating plan is a healthy eating plan that has been shown to reduce high blood pressure (hypertension). It may also reduce your risk for type 2 diabetes, heart disease, and stroke.  With the DASH eating plan, you should limit salt (sodium) intake to 2,300 mg a day. If you have hypertension, you may need to  reduce your sodium intake to 1,500 mg a day.  When on the DASH eating plan, aim to eat more fresh fruits and vegetables, whole grains, lean proteins, low-fat dairy, and heart-healthy fats.  Work with your health care provider or diet and nutrition specialist (dietitian) to adjust your eating plan to your individual calorie needs. This information is not intended to replace advice given to you by your health care provider. Make sure you discuss any questions you have with your health care provider. Document Released: 02/01/2011 Document Revised:  02/06/2016 Document Reviewed: 02/06/2016 Elsevier Interactive Patient Education  2017 [employer].

## 2016-06-21 NOTE — Progress Notes (Signed)
   CC: DM follow up  HPI:  [name] is a 60 y.o. male with a past medical history listed below here today for follow up of his DM.    For details of today's visit and the status of his chronic medical issues please refer to the assessment and plan.  Past Medical History:  Diagnosis Date  . Constipation   . Diabetes mellitus   . GERD (gastroesophageal reflux disease)   . History of chest pain 2002  . Hyperlipidemia   . Hypertension   . Morbid obesity (HCC)     Review of Systems:   Review of Systems  Respiratory: Negative for shortness of breath.   Cardiovascular: Negative for chest pain.    Physical Exam:  Vitals:   06/21/16 1007  BP: (!) 145/96  Pulse: 73  Temp: 98.3 F (36.8 C)  TempSrc: Oral  SpO2: 100%  Weight: 227 lb 14.4 oz (103.4 kg)  Height: 6' (1.829 m)   Physical Exam  Constitutional: He is oriented to person, place, and time and well-developed, well-nourished, and in no distress.  Cardiovascular: Normal rate, regular rhythm and normal heart sounds.   Pulmonary/Chest: Effort normal and breath sounds normal.  Neurological: He is alert and oriented to person, place, and time.  Skin: Skin is warm and dry.  Psychiatric: Mood and affect normal.  Vitals reviewed.   Assessment & Plan:   See Encounters Tab for problem based charting.  Patient discussed with Dr. Oswaldo Done

## 2016-06-22 NOTE — Assessment & Plan Note (Signed)
BP Readings from Last 3 Encounters:  06/21/16 (!) 129/95  05/10/16 (!) 142/88  03/29/16 132/82    Lab Results  Component Value Date   NA 139 03/29/2016   K 4.5 03/29/2016   CREATININE 0.88 03/29/2016   BP 129/95 today Currently on HCTZ 25 mg daily, lisinopril 40 mg daily and lopressor 50 mg bid. Denies any headaches, vision changes. Does report high salt intake in his diet.   Assessment: htn  Plan: Continue current medications Discussed limiting salt intake

## 2016-06-22 NOTE — Progress Notes (Signed)
Internal Medicine Clinic Attending  Case discussed with Dr. Boswell at the time of the visit.  We reviewed the resident's history and exam and pertinent patient test results.  I agree with the assessment, diagnosis, and plan of care documented in the resident's note.  

## 2016-06-22 NOTE — Assessment & Plan Note (Signed)
Lab Results  Component Value Date   HGBA1C 8.5 06/21/2016   HGBA1C 9.7 03/29/2016   HGBA1C 9.8 12/21/2015    Glenn Washington presents for DM follow up today. He was last seen in clinic on 05/10/16. At that time he reported that he had just received the empagliflozin the day prior and started taking it.   He is currently on Lantus 34 units qhs, metformin 2000 mg daily, glipizide 20 mg dialy and empagliflozin 10 mg daily.  Today, he reports to me taht he started the jardiance 2 weeks ago. He denies any side effects. No episodes of hypoglycemia.  He does bring his meter with him today but unfortunately the time and date is set to 2005 and the computer will not allow it to be downloaded. Manually scrolling through his readings he appears to be consistently in the 150-220 range. Did have one low reading of 97 this morning but denies any symptoms at that time.   A1c today is 8.5  Assessment: DM type II, improving  Plan: Continue current medications Fixed time and date issue on his monitor  F/U with PCP in 3 months

## 2016-08-13 ENCOUNTER — Encounter: Payer: Self-pay | Admitting: *Deleted

## 2016-08-21 ENCOUNTER — Other Ambulatory Visit: Payer: Self-pay

## 2016-08-21 MED ORDER — METFORMIN HCL ER 500 MG PO TB24: 2000.0000 mg | ORAL_TABLET | Freq: Every day | ORAL | 1 refills | Status: DC

## 2016-08-21 NOTE — Telephone Encounter (Signed)
Needs appt PCP or [facility] end of July or in aug DM F/U

## 2016-08-21 NOTE — Telephone Encounter (Signed)
metFORMIN (GLUCOPHAGE-XR) 500 MG 24 hr tablet, refill request [facility].

## 2016-08-22 ENCOUNTER — Other Ambulatory Visit: Payer: Self-pay

## 2016-08-22 ENCOUNTER — Telehealth: Payer: Self-pay | Admitting: Internal Medicine

## 2016-08-22 NOTE — Telephone Encounter (Signed)
[facility]formin called to [facility], patient made aware.

## 2016-08-22 NOTE — Telephone Encounter (Signed)
Called to [facility] dept pharm

## 2016-08-22 NOTE — Telephone Encounter (Signed)
Patient states his Met[facility] should go to [facility] instead of [facility] on [address].

## 2016-08-28 ENCOUNTER — Other Ambulatory Visit: Payer: Self-pay

## 2016-08-28 DIAGNOSIS — I10 Essential (primary) hypertension: Secondary | ICD-10-CM

## 2016-08-28 MED ORDER — HYDROCHLOROTHIAZIDE 25 MG PO TABS: 25.0000 mg | ORAL_TABLET | Freq: Every day | ORAL | 3 refills | Status: DC

## 2016-08-28 MED ORDER — ATORVASTATIN CALCIUM 40 MG PO TABS: 40.0000 mg | ORAL_TABLET | Freq: Every day | ORAL | 3 refills | Status: DC

## 2016-08-28 MED ORDER — METOPROLOL TARTRATE 50 MG PO TABS: 50.0000 mg | ORAL_TABLET | Freq: Two times a day (BID) | ORAL | 3 refills | Status: DC

## 2016-08-28 MED ORDER — LISINOPRIL 40 MG PO TABS: 40.0000 mg | ORAL_TABLET | Freq: Every day | ORAL | 3 refills | Status: DC

## 2016-09-20 ENCOUNTER — Ambulatory Visit (INDEPENDENT_AMBULATORY_CARE_PROVIDER_SITE_OTHER): Payer: Self-pay | Admitting: Internal Medicine

## 2016-09-20 ENCOUNTER — Encounter: Payer: Self-pay | Admitting: Internal Medicine

## 2016-09-20 VITALS — BP 152/90 | HR 82 | Temp 97.8°F | Ht 72.0 in | Wt 276.2 lb

## 2016-09-20 DIAGNOSIS — Z833 Family history of diabetes mellitus: Secondary | ICD-10-CM

## 2016-09-20 DIAGNOSIS — Z7984 Long term (current) use of oral hypoglycemic drugs: Secondary | ICD-10-CM

## 2016-09-20 DIAGNOSIS — Z794 Long term (current) use of insulin: Secondary | ICD-10-CM

## 2016-09-20 DIAGNOSIS — Z87891 Personal history of nicotine dependence: Secondary | ICD-10-CM

## 2016-09-20 DIAGNOSIS — K219 Gastro-esophageal reflux disease without esophagitis: Secondary | ICD-10-CM

## 2016-09-20 DIAGNOSIS — Z79899 Other long term (current) drug therapy: Secondary | ICD-10-CM

## 2016-09-20 DIAGNOSIS — IMO0002 Reserved for concepts with insufficient information to code with codable children: Secondary | ICD-10-CM

## 2016-09-20 DIAGNOSIS — I10 Essential (primary) hypertension: Secondary | ICD-10-CM

## 2016-09-20 DIAGNOSIS — E1169 Type 2 diabetes mellitus with other specified complication: Secondary | ICD-10-CM

## 2016-09-20 DIAGNOSIS — E1165 Type 2 diabetes mellitus with hyperglycemia: Secondary | ICD-10-CM

## 2016-09-20 DIAGNOSIS — E1129 Type 2 diabetes mellitus with other diabetic kidney complication: Secondary | ICD-10-CM

## 2016-09-20 DIAGNOSIS — E785 Hyperlipidemia, unspecified: Secondary | ICD-10-CM

## 2016-09-20 LAB — GLUCOSE, CAPILLARY: Glucose-Capillary: 144 mg/dL — ABNORMAL HIGH (ref 65–99)

## 2016-09-20 LAB — POCT GLYCOSYLATED HEMOGLOBIN (HGB A1C): Hemoglobin A1C: 7.9

## 2016-09-20 MED ORDER — NIFEDIPINE ER OSMOTIC RELEASE 60 MG PO TB24: 60.0000 mg | ORAL_TABLET | Freq: Every day | ORAL | 1 refills | Status: DC

## 2016-09-20 MED ORDER — ATORVASTATIN CALCIUM 40 MG PO TABS: 40.0000 mg | ORAL_TABLET | Freq: Every day | ORAL | 3 refills | Status: AC

## 2016-09-20 MED ORDER — INSULIN GLARGINE 100 UNIT/ML SOLOSTAR PEN: 34.0000 [IU] | PEN_INJECTOR | Freq: Every day | SUBCUTANEOUS | 2 refills | Status: AC

## 2016-09-20 MED ORDER — GLIPIZIDE ER 10 MG PO TB24: 20.0000 mg | ORAL_TABLET | Freq: Every day | ORAL | 11 refills | Status: AC

## 2016-09-20 MED ORDER — EMPAGLIFLOZIN 10 MG PO TABS: 10.0000 mg | ORAL_TABLET | Freq: Every day | ORAL | 2 refills | Status: AC

## 2016-09-20 MED ORDER — INSULIN PEN NEEDLE 31G X 5 MM MISC: 1 refills | Status: AC

## 2016-09-20 MED ORDER — METFORMIN HCL ER 500 MG PO TB24: 2000.0000 mg | ORAL_TABLET | Freq: Every day | ORAL | 1 refills | Status: AC

## 2016-09-20 MED ORDER — LISINOPRIL 40 MG PO TABS: 40.0000 mg | ORAL_TABLET | Freq: Every day | ORAL | 3 refills | Status: AC

## 2016-09-20 MED ORDER — HYDROCHLOROTHIAZIDE 25 MG PO TABS: 25.0000 mg | ORAL_TABLET | Freq: Every day | ORAL | 3 refills | Status: AC

## 2016-09-20 NOTE — Patient Instructions (Addendum)
It was a pleasure to meet you today, Glenn Washington!  You are doing a great job with your diabetes. Your A1c today was 7.9. Keep up the good work with remembering to take your medicines and your insulin!  We are changing your blood pressure medicines. Stop taking the metoprolol twice a day, and start taking Procardia (nifedipine) 60mg  once a day. Keep it up with the exercising and trying to eat less salt.  Please call the eye doctor for an appointment.  We are sending a referral for the foot doctor. They will call you to schedule it.  We will see you back in 6 weeks to re-check your blood pressure.

## 2016-09-20 NOTE — Assessment & Plan Note (Addendum)
Assessment - BP 150/89. Recheck 152/90 - Reports that HCTZ makes him use the restroom a lot so when he goes out somewhere, he occasionally doesn't take it. Does not take his afternoon dose of metoprolol - Denies CP, SOB, headaches, or blurry vision - He is trying to exercise more. Says he walks 36 blocks a week and is considering [facility] the [facility] - He tries to eat a low salt diet. Eats out 2-3 times/week.  Plan He has been on procardia in the past and was tolerating it. I am unsure why this was discontinued, but he is not taking the second dose of his metoprolol. He does not have diagnosed heart disease, so we will switch his metoprolol to procardia to decrease his pill burden. - Stop metoprolol - Start procardia XL 60mg  daily - Continue lisinopril 40mg  daily, HCTZ 25mg  daily (which he takes intermittently) - Continue low salt diet and exercise - Follow up in 6 weeks for recheck BP. - Consider rechecking BMP in 3 months (watch Ca).

## 2016-09-20 NOTE — Progress Notes (Signed)
   CC: diabetes management  HPI: Mr.Prabhav Gaynelle AduMcCain is a 61 y.o. male with PMH significant for HTN, HLD, and DM2 who presents for diabetes management.  Please see the assessment and plan below for the status of the patient's chronic medical problems.  Past Medical History:  Diagnosis Date  . Constipation   . Diabetes mellitus   . GERD (gastroesophageal reflux disease)   . History of chest pain 2002  . Hyperlipidemia   . Hypertension   . Morbid obesity (HCC)    Social History: He denies smoking. Endorses ~3-6 glasses of alcohol a day occasionally. Denies other illicit drugs. Lives in [place] with his brother.   Review of Systems  Constitutional: Negative for chills, fever, malaise/fatigue and weight loss.  HENT: Negative for congestion, hearing loss and sore throat.   Eyes: Negative for blurred vision.  Respiratory: Negative for cough and shortness of breath.   Cardiovascular: Negative for chest pain, palpitations and leg swelling.  Gastrointestinal: Negative for abdominal pain, blood in stool, constipation, diarrhea and heartburn.  Genitourinary: Negative for dysuria.  Musculoskeletal: Negative for myalgias.  Skin: Negative for rash.  Neurological: Negative for focal weakness, weakness and headaches.   Physical Exam:  Vitals:   09/20/16 1343  BP: (!) 150/89  Pulse: 82  Temp: 97.8 F (36.6 C)  TempSrc: Oral  SpO2: 98%  Weight: 276 lb 3.2 oz (125.3 kg)  Height: 6' (1.829 m)  Repeat BP 152/90  GEN: Well-appearing African-American obese male. Alert and oriented. Sitting in chair in no acute distress. HENT: Moist mucous membranes. No visible lesions. EYES: EOMI. Sclera anicteric. PERRL. RESP: Clear to auscultation bilaterally. No wheezes, rales, or rhonchi. CV: Normal rate and regular rhythm. No murmurs, gallops, or rubs. No LE edema. ABD: Soft. Non-tender. Non-distended. Normoactive bowel sounds. EXT: No clubbing, cyanosis, or edema. Warm and well perfused. NEURO:  Cranial nerves II-XII grossly intact. Able to lift all four extremities against gravity.  Assessment & Plan:   See Encounters Tab for problem based charting.  Patient seen with Dr. Criselda PeachesMullen.  Scherrie GerlachJennifer Kohl Polinsky, MD Internal Medicine, PGY-1

## 2016-09-20 NOTE — Assessment & Plan Note (Signed)
Assessment Last lipid panel in 2017 with LDL 111  Plan - Will not recheck lipid panel for now. - Continue atorvastatin 40mg  daily.

## 2016-09-20 NOTE — Assessment & Plan Note (Addendum)
Assessment - HgbA1c down from 8.5 to 7.9 today. - Patient reports he has been doing a better job of remembering to take his medications. - Forgot his meter, but says he checks his BG 1-2 times at home. Usually runs in ~140-150. - One instance of BG to 80, felt a little dizzy. Resolved after eating a snack. No other hypoglycemic episodes. - Tolerating his medications well. - Last Cr in 03/2016 was 0.88  Plan - Reminded patient to call optometrist for appointment. - Cont metformin 2g daily, lantus 34u QHS, glipizide 20mg  daily, empagliflozin 10mg  daily - Podiatry referral

## 2016-09-20 NOTE — Assessment & Plan Note (Signed)
No heartburn symptoms. Not taking prilosec.

## 2016-09-25 NOTE — Progress Notes (Signed)
Internal Medicine Clinic Attending  I saw and evaluated the patient.  I personally confirmed the key portions of the history and exam documented by Dr. Huang and I reviewed pertinent patient test results.  The assessment, diagnosis, and plan were formulated together and I agree with the documentation in the resident's note.  

## 2016-10-03 ENCOUNTER — Telehealth: Payer: Self-pay | Admitting: *Deleted

## 2016-10-18 ENCOUNTER — Encounter: Payer: Self-pay | Admitting: Internal Medicine

## 2016-10-23 ENCOUNTER — Ambulatory Visit: Payer: Self-pay

## 2016-10-24 ENCOUNTER — Encounter: Payer: Self-pay | Admitting: Internal Medicine

## 2016-10-25 ENCOUNTER — Ambulatory Visit (INDEPENDENT_AMBULATORY_CARE_PROVIDER_SITE_OTHER): Payer: Self-pay | Admitting: Internal Medicine

## 2016-10-25 VITALS — BP 113/85 | HR 93 | Temp 98.0°F | Ht 73.0 in | Wt 269.7 lb

## 2016-10-25 DIAGNOSIS — E1165 Type 2 diabetes mellitus with hyperglycemia: Secondary | ICD-10-CM

## 2016-10-25 DIAGNOSIS — I10 Essential (primary) hypertension: Secondary | ICD-10-CM

## 2016-10-25 DIAGNOSIS — E1129 Type 2 diabetes mellitus with other diabetic kidney complication: Secondary | ICD-10-CM

## 2016-10-25 DIAGNOSIS — Z87891 Personal history of nicotine dependence: Secondary | ICD-10-CM

## 2016-10-25 DIAGNOSIS — Z79899 Other long term (current) drug therapy: Secondary | ICD-10-CM

## 2016-10-25 DIAGNOSIS — E119 Type 2 diabetes mellitus without complications: Secondary | ICD-10-CM

## 2016-10-25 DIAGNOSIS — IMO0002 Reserved for concepts with insufficient information to code with codable children: Secondary | ICD-10-CM

## 2016-10-25 LAB — GLUCOSE, CAPILLARY: Glucose-Capillary: 226 mg/dL — ABNORMAL HIGH (ref 65–99)

## 2016-10-25 NOTE — Progress Notes (Signed)
   CC: Hypertension follow-up  HPI:  [name] is a 61 y.o. male with PMH listed below who presents to clinic for hypertension follow-up. Please see problem based assessment and plan for further details.  Past Medical History:  Diagnosis Date  . Constipation   . Diabetes mellitus   . GERD (gastroesophageal reflux disease)   . History of chest pain 2002  . Hyperlipidemia   . Hypertension   . Morbid obesity (HCC)    Review of Systems:   Review of Systems  Constitutional: Negative for chills and fever.  Respiratory: Negative for cough and shortness of breath.   Cardiovascular: Negative for chest pain, palpitations and leg swelling.  Gastrointestinal: Negative for abdominal pain, constipation, diarrhea, nausea and vomiting.    Physical Exam:  Vitals:   10/25/16 1038  BP: 113/85  Pulse: 93  Temp: 98 F (36.7 C)  TempSrc: Oral  SpO2: 97%  Weight: 269 lb 11.2 oz (122.3 kg)  Height: 6\' 1"  (1.854 m)   General: Pleasant male, well-nourished, well-developed, no acute distress Cardiac: regular rate and rhythm, nl S1/S2, no murmurs, rubs or gallops  Pulm: CTAB, no wheezes or crackles, no increased work of breathing     Assessment & Plan:   See Encounters Tab for problem based charting.  Patient seen with Dr. Heide SparkNarendra

## 2016-10-25 NOTE — Patient Instructions (Signed)
It was nice to meet you today, Glenn Washington.  Your blood pressure looked excellent today. Keep up the good work!  Continue taking Procardia, lisinopril, and HCTZ as you usually do.  Referral to podiatry an optometrist was made 1 month ago. They will call you to schedule an appointment.  Please call the internal medicine clinic if you have further questions.

## 2016-10-25 NOTE — Assessment & Plan Note (Signed)
Patient presents today for hypertension follow-up. Recently seen 7/16 at which time his hypertension was uncontrolled due to medication nonadherence. Metoprolol was discontinued at the time and he was started on Procardia 60 mg daily. He was continued on lisinopril 40 and HCTZ 25 daily. Patient reports compliance with medications. BP at goal today 113/85. Denies headache, blurry vision, chest pain, shortness of breath, and lower extremity swelling.  HTN: Stable and controlled - Continue current regimen: Procardia 60 mg daily + lisinopril 40 mg daily + HCTZ 25 mg daily

## 2016-10-25 NOTE — Assessment & Plan Note (Signed)
T2DM: well controlled. A1c 7.9 08/2017  - Patient counseled on adherence to low carbohydrate diet. No other interventions needed at this visit.

## 2016-10-30 NOTE — Progress Notes (Signed)
Internal Medicine Clinic Attending  I saw and evaluated the patient.  I personally confirmed the key portions of the history and exam documented by Dr. Santos-Sanchez and I reviewed pertinent patient test results.  The assessment, diagnosis, and plan were formulated together and I agree with the documentation in the resident's note. 

## 2016-11-22 ENCOUNTER — Telehealth: Payer: Self-pay | Admitting: Internal Medicine

## 2016-11-22 NOTE — Telephone Encounter (Signed)
Pls call pt regarding refills °

## 2016-11-22 NOTE — Telephone Encounter (Signed)
Spoke w/ pt he states he does not have any more refills at pharm, he was assured that he did, placed him on hold and called [facility] pharm, he has 6 refills in all, they will get it ready and call him, he hung up while on hold, tried to call back and went straight to his full vmail

## 2016-11-23 ENCOUNTER — Other Ambulatory Visit: Payer: Self-pay

## 2016-11-23 DIAGNOSIS — E1165 Type 2 diabetes mellitus with hyperglycemia: Principal | ICD-10-CM

## 2016-11-23 DIAGNOSIS — E1129 Type 2 diabetes mellitus with other diabetic kidney complication: Secondary | ICD-10-CM

## 2016-11-23 DIAGNOSIS — IMO0002 Reserved for concepts with insufficient information to code with codable children: Secondary | ICD-10-CM

## 2016-11-23 NOTE — Telephone Encounter (Signed)
metFORMIN (GLUCOPHAGE-XR) 500 MG 24 hr tablet, refill request @ [facility] department.

## 2016-11-23 NOTE — Telephone Encounter (Signed)
I spoke w/ pt and pharmacy 9/27, this should be resolved

## 2016-11-29 ENCOUNTER — Other Ambulatory Visit: Payer: Self-pay

## 2016-11-29 ENCOUNTER — Other Ambulatory Visit: Payer: Self-pay | Admitting: *Deleted

## 2016-11-29 DIAGNOSIS — I10 Essential (primary) hypertension: Secondary | ICD-10-CM

## 2016-11-29 MED ORDER — NIFEDIPINE ER OSMOTIC RELEASE 60 MG PO TB24: 60.0000 mg | ORAL_TABLET | Freq: Every day | ORAL | 1 refills | Status: AC

## 2016-11-29 NOTE — Telephone Encounter (Signed)
NIFEdipine (PROCARDIA XL) 60 MG 24 hr tablet, refill request @ [facility] department. Pt would like this med by tomorrow.

## 2016-12-03 ENCOUNTER — Ambulatory Visit: Payer: Self-pay | Attending: Internal Medicine | Admitting: Sports Medicine

## 2016-12-03 DIAGNOSIS — M79674 Pain in right toe(s): Secondary | ICD-10-CM

## 2016-12-03 DIAGNOSIS — Q828 Other specified congenital malformations of skin: Secondary | ICD-10-CM

## 2016-12-03 DIAGNOSIS — M79675 Pain in left toe(s): Secondary | ICD-10-CM

## 2016-12-03 DIAGNOSIS — B351 Tinea unguium: Secondary | ICD-10-CM

## 2016-12-03 NOTE — Progress Notes (Signed)
Subjective: Cedarius Kersh is a 61 y.o. male patient with history of diabetes who presents to free clinic today complaining of long, painful nails and callus  while ambulating in shoes; unable to trim. Patient states that the glucose reading this morning was  not recorded. Patient denies any new changes in medication or new problems. Patient denies any new cramping, numbness, burning or tingling in the legs.  Patient Active Problem List   Diagnosis Date Noted  . Subungual hematoma of toenail of right foot 01/06/2016  . Ischemic heart disease 12/21/2015  . 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 Health care maintenance 09/18/2011  . History of Cocaine abuse currently in remission 03/09/2008  . Hyperlipidemia 01/03/2006  . OBESITY, MORBID 01/03/2006  . Essential hypertension 01/03/2006  . Diabetes mellitus type 2, uncontrolled (HCC) 02/26/1990   Current Outpatient Prescriptions on File Prior to Visit  Medication Sig Dispense Refill  . aspirin 81 MG tablet Take 81 mg by mouth daily.      Marland Kitchen atorvastatin (LIPITOR) 40 MG tablet Take 1 tablet (40 mg total) by mouth daily. 90 tablet 3  . Blood Glucose Monitoring Suppl (TRUE TRACK BLOOD GLUCOSE) DEVI 1 each by Does not apply route 2 (two) times daily.      . empagliflozin (JARDIANCE) 10 MG TABS tablet Take 10 mg by mouth daily. 30 tablet 2  . glipiZIDE (GLUCOTROL XL) 10 MG 24 hr tablet Take 2 tablets (20 mg total) by mouth daily with breakfast. 60 tablet 11  . glucose blood test strip 1 each by Other route as needed. Use as instructed     . hydrochlorothiazide (HYDRODIURIL) 25 MG tablet Take 1 tablet (25 mg total) by mouth daily. 90 tablet 3  . Insulin Glargine (LANTUS SOLOSTAR) 100 UNIT/ML Solostar Pen Inject 34 Units into the skin daily. 45 mL 2  . Insulin Pen Needle 31G X 5 MM MISC Use once daily. 100 each 1  . LANCETS ULTRA THIN 30G MISC 1 each by Does not apply route 2 (two) times daily.      Marland Kitchen lisinopril  (PRINIVIL,ZESTRIL) 40 MG tablet Take 1 tablet (40 mg total) by mouth daily. 90 tablet 3  . metFORMIN (GLUCOPHAGE-XR) 500 MG 24 hr tablet Take 4 tablets (2,000 mg total) by mouth daily with breakfast. 360 tablet 1  . NIFEdipine (PROCARDIA XL) 60 MG 24 hr tablet Take 1 tablet (60 mg total) by mouth daily. 30 tablet 1   No current facility-administered medications on file prior to visit.    No Known Allergies  Recent Results (from the past 2160 hour(s))  Glucose, capillary     Status: Abnormal   Collection Time: 09/20/16  1:46 PM  Result Value Ref Range   Glucose-Capillary 144 (H) 65 - 99 mg/dL  POCT HgB A2Z (CPT 30865)     Status: None   Collection Time: 09/20/16  1:55 PM  Result Value Ref Range   Hemoglobin A1C 7.9   Glucose, capillary     Status: Abnormal   Collection Time: 10/25/16 10:47 AM  Result Value Ref Range   Glucose-Capillary 226 (H) 65 - 99 mg/dL    Objective: General: Patient is awake, alert, and oriented x 3 and in no acute distress.  Integument: Skin is warm, dry and supple bilateral. Nails are tender, long, thickened and  dystrophic with subungual debris, consistent with onychomycosis, 1-5 bilateral. No signs of infection. + callus with nucleated core plantar forefoot x 4 bilateral. Remaining integument unremarkable.  Vasculature:  Dorsalis Pedis pulse 1/4 bilateral. Posterior Tibial pulse  1/4 bilateral.  Capillary fill time <3 sec 1-5 bilateral. Positive hair growth to the level of the digits. Temperature gradient within normal limits. No varicosities present bilateral. No edema present bilateral.   Neurology: The patient has intact sensation measured with a 5.07/10g Semmes Weinstein Monofilament at all pedal sites bilateral . Vibratory sensation diminished bilateral with tuning fork. No Babinski sign present bilateral.   Musculoskeletal: Asymptomatic hammertoe pedal deformities noted bilateral. Muscular strength 5/5 in all lower extremity muscular groups bilateral  without pain on range of motion . No tenderness with calf compression bilateral.  Assessment and Plan: Problem List Items Addressed This Visit    None    Visit Diagnoses    Dermatophytosis of nail    -  Primary   Pain in toes of both feet       Porokeratosis          -Examined patient. -Discussed and educated patient on diabetic foot care, especially with  regards to the vascular, neurological and musculoskeletal systems.  -Stressed the importance of good glycemic control and the detriment of not  controlling glucose levels in relation to the foot. -Mechanically debrided callus x 4 using sterile chisel blade and all nails 1-5 bilateral using sterile nail nipper and filed with dremel without incident  -Recommend daily skin emollients  -Answered all patient questions -Patient to return  in 3 months for at risk foot care  Asencion Islam, DPM

## 2016-12-10 ENCOUNTER — Ambulatory Visit (INDEPENDENT_AMBULATORY_CARE_PROVIDER_SITE_OTHER): Payer: Self-pay | Admitting: *Deleted

## 2016-12-10 DIAGNOSIS — Z23 Encounter for immunization: Secondary | ICD-10-CM

## 2016-12-18 ENCOUNTER — Ambulatory Visit (HOSPITAL_COMMUNITY)
Admission: RE | Admit: 2016-12-18 | Discharge: 2016-12-18 | Disposition: A | Discharge status: Home / Self Care | Payer: Self-pay | Source: Ambulatory Visit | Attending: Internal Medicine | Admitting: Internal Medicine

## 2016-12-18 ENCOUNTER — Ambulatory Visit (INDEPENDENT_AMBULATORY_CARE_PROVIDER_SITE_OTHER): Payer: Self-pay | Admitting: Internal Medicine

## 2016-12-18 ENCOUNTER — Encounter: Payer: Self-pay | Admitting: Internal Medicine

## 2016-12-18 VITALS — BP 147/88 | HR 98 | Temp 97.9°F | Ht 72.0 in | Wt 277.6 lb

## 2016-12-18 DIAGNOSIS — Z7982 Long term (current) use of aspirin: Secondary | ICD-10-CM

## 2016-12-18 DIAGNOSIS — E119 Type 2 diabetes mellitus without complications: Secondary | ICD-10-CM | POA: Insufficient documentation

## 2016-12-18 DIAGNOSIS — I208 Other forms of angina pectoris: Secondary | ICD-10-CM | POA: Insufficient documentation

## 2016-12-18 DIAGNOSIS — E785 Hyperlipidemia, unspecified: Secondary | ICD-10-CM | POA: Insufficient documentation

## 2016-12-18 DIAGNOSIS — I10 Essential (primary) hypertension: Secondary | ICD-10-CM

## 2016-12-18 DIAGNOSIS — M25561 Pain in right knee: Secondary | ICD-10-CM | POA: Insufficient documentation

## 2016-12-18 DIAGNOSIS — G8929 Other chronic pain: Secondary | ICD-10-CM | POA: Insufficient documentation

## 2016-12-18 DIAGNOSIS — I2089 Other forms of angina pectoris: Secondary | ICD-10-CM

## 2016-12-18 DIAGNOSIS — M25562 Pain in left knee: Secondary | ICD-10-CM

## 2016-12-18 DIAGNOSIS — R079 Chest pain, unspecified: Secondary | ICD-10-CM

## 2016-12-18 DIAGNOSIS — Z87891 Personal history of nicotine dependence: Secondary | ICD-10-CM

## 2016-12-18 DIAGNOSIS — I739 Peripheral vascular disease, unspecified: Secondary | ICD-10-CM

## 2016-12-18 DIAGNOSIS — K219 Gastro-esophageal reflux disease without esophagitis: Secondary | ICD-10-CM | POA: Insufficient documentation

## 2016-12-18 MED ORDER — ACETAMINOPHEN 500 MG PO TABS: 500.0000 mg | ORAL_TABLET | Freq: Three times a day (TID) | ORAL | 2 refills | Status: AC | PRN

## 2016-12-18 NOTE — Patient Instructions (Signed)
It was a pleasure to see you Mr. Gaynelle AduMcCain.  For your chest discomfort, I am referring you back to the heart doctors for further evaluation.  Your knee pain sounds like osteoarthritis. You can use Tylenol as needed up to 3000 mg total per day. You can also use arthritis creams as needed.  Please continue your current medications as prescribed.  Please follow up with Dr. Renaldo ReelHuang as scheduled or see us sooner if needed.

## 2016-12-18 NOTE — Progress Notes (Signed)
CC: chest discomfort, knee pain  HPI:  Glenn Washington is a 61 y.o. male with PMH as listed below including HTN and T2DM who presents for evaluation of chest pain and knee pain.  Stable Angina: Patient reports intermittent left sided chest pain over the last year described as almost pressure-like without radiation to his arms, neck, jaw, or back. He says the pain nearly always occurs at night when he is sleep on his left side. The discomfort lasts for 2-4 minutes before resolving. He sometimes takes an extra low-dose Aspirin for relief. He says he can generally walk 6-8 blocks without issue, but did notice similar chest pain on 1 or 2 occasions when walking. He does not over exert himself past walking. He has not had associated shortness of breath, diaphoresis, nausea, vomiting, or palpitations. He does say he has occasional cramping in his calves which occurs when walking and improves with rest. 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 He thinks he had a cardiac catheterization in 2002. He was previously seen by Cardiology, Dr. Donnie Aho in the past. He reports former cigarette smoking when he was 62 years old, no use since. He drinks on average 12-15 12 oz beers over 1 week. He reports former cocaine use with last use 7-8 years ago.   Bilateral Knee Pain: Patient reports chronic bilateral knee pain. 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 He says the pain is a tingling sensation without stiffness. He notices the pain is worse when moving after rest and improves with activity. He has not noticed any swelling of his knees. He has not had issues with his legs locking up or giving out on him. He has not tried anything for his pain.   Past Medical History:  Diagnosis Date  . Constipation   . Diabetes mellitus   . GERD (gastroesophageal reflux disease)   . History of chest pain 2002  . Hyperlipidemia   .  Hypertension   . Morbid obesity (HCC)    Review of Systems:   Review of Systems  Constitutional: Negative for chills and diaphoresis.  Cardiovascular: Positive for chest pain and claudication. Negative for palpitations and leg swelling.  Gastrointestinal: Negative for nausea and vomiting.  Musculoskeletal: Positive for joint pain. Negative for falls.       Bilateral knee pain  Neurological: Negative for dizziness, focal weakness and loss of consciousness.  Psychiatric/Behavioral: Negative for substance abuse.     Physical Exam:  Vitals:   12/18/16 0947  BP: (!) 147/88  Pulse: 98  Temp: 97.9 F (36.6 C)  TempSrc: Oral  SpO2: 100%  Weight: 277 lb 9.6 oz (125.9 kg)  Height: 6' (1.829 m)   Physical Exam  Constitutional: He is oriented to person, place, and time. He appears well-developed and well-nourished. No distress.  HENT:  Head: Normocephalic and atraumatic.  Cardiovascular: Normal rate and regular rhythm.   No murmur heard. Pulmonary/Chest: Effort normal. No respiratory distress. He has no wheezes. He has no rales. He exhibits no tenderness.  Musculoskeletal: Normal range of motion. He exhibits no edema or tenderness.       Right shoulder: He exhibits normal range of motion, no tenderness and no bony tenderness.       Left shoulder: He exhibits normal range of motion, no tenderness and no bony tenderness.  Bilateral knees without tenderness to palpation, effusion, joint laxity,  erythema, obvious injury, crepitus, or decreased ROM. Strength 5/5 with extension at both knees.  Neurological: He is alert and oriented to person, place, and time.  Skin: Skin is warm. He is not diaphoretic.  Psychiatric: He has a normal mood and affect.    Assessment & Plan:   See Encounters Tab for problem based charting.  Patient discussed with Dr. Melanee SpryButcher  Stable angina [facility](HCC) Patient with symptoms of stable angina. A repeat EKG this visit shows Q-waves in leads III and aVF which were  present on prior in Oct 2017. Heart rate on repeat EKG is faster than prior, otherwise no significant change from prior. Unfortunately, we do not have his prior cardiac workup results available in EPIC. It is also possible that his pain is musculoskeletal in nature, however shoulder and chest wall exam is normal. Given his history, I will refer him back to cardiology for further evaluation of his anginal type pain. - Continue Aspirin 81 mg daily - Continue Atorvastatin 40 mg daily - Refer to Cardiology - Patient advised on alarm symptoms/signs (worsened chest pain, unrelieved chest pain, new symptoms of nausea/diaphoresis/dyspnea, etc.) which would warrant emergency medical services  Bilateral chronic knee pain Patient's bilateral knee pain is consistent with osteoarthritis. He is not having significant limitations in mobility or ADLs at this time. Will recommend conservative management with as needed Tylenol and topical creams/gels. Will limit NSAIDs for now given his symptoms of stable angina. - Tylenol as needed (limit to 3000 mg per day) and topical arthritis creams/gels - PT in future if needed

## 2016-12-19 NOTE — Assessment & Plan Note (Addendum)
Patient with symptoms of stable angina. A repeat EKG this visit shows Q-waves in leads III and aVF which were present on prior in Oct 2017. Heart rate on repeat EKG is faster than prior, otherwise no significant change from prior. Unfortunately, we do not have his prior cardiac workup results available in EP[facility]. It is also possible that his pain is musculoskeletal in nature, however shoulder and chest wall exam is normal. Given his history, I will refer him back to cardiology for further evaluation of his anginal type pain. Initial EKG in clinic showed artifact, repeat was a better tracing however was not uploaded into EPIC (available in MUSE) which we are working on. - Continue Aspirin 81 mg daily - Continue Atorvastatin 40 mg daily - Refer to Cardiology - Patient advised on alarm symptoms/signs (worsened chest pain, unrelieved chest pain, new symptoms of nausea/diaphoresis/dyspnea, etc.) which would warrant emergency medical services

## 2016-12-19 NOTE — Assessment & Plan Note (Signed)
Patient's bilateral knee pain is consistent with osteoarthritis. He is not having significant limitations in mobility or ADLs at this time. Will recommend conservative management with as needed Tylenol and topical creams/gels. Will limit NSAIDs for now given his symptoms of stable angina. - Tylenol as needed (limit to 3000 mg per day) and topical arthritis creams/gels - PT in future if needed

## 2016-12-26 NOTE — Progress Notes (Signed)
[facility] Attending  Case discussed with Dr. Patel at the time of the visit.  We reviewed the resident's history and exam and pertinent patient test results.  I agree with the assessment, diagnosis, and plan of care documented in the resident's note.  

## 2017-01-19 ENCOUNTER — Encounter: Payer: Self-pay | Admitting: *Deleted

## 2017-01-23 NOTE — Telephone Encounter (Signed)
closed

## 2017-01-23 NOTE — Telephone Encounter (Signed)
done

## 2017-01-26 DEATH — deceased

## 2017-03-04 ENCOUNTER — Encounter: Payer: Self-pay | Admitting: Internal Medicine

## 2017-04-18 ENCOUNTER — Encounter: Payer: Self-pay | Admitting: Internal Medicine
